# Patient Record
Sex: Female | Born: 1965 | Race: White | Hispanic: No | Marital: Married | Smoking: Current every day smoker
Health system: Southern US, Community
[De-identification: ages and names within clinical notes are randomized; demographics above are authoritative.]

## PROBLEM LIST (undated history)

## (undated) HISTORY — PX: ECTOPIC PREGNANCY SURGERY: SHX613

---

## 2010-06-23 ENCOUNTER — Emergency Department (HOSPITAL_BASED_OUTPATIENT_CLINIC_OR_DEPARTMENT_OTHER): Admission: EM | Admit: 2010-06-23 | Discharge: 2010-06-23 | Payer: Self-pay | Admitting: Emergency Medicine

## 2010-12-09 LAB — URINALYSIS, ROUTINE W REFLEX MICROSCOPIC
Ketones, ur: 40 mg/dL — AB
Nitrite: POSITIVE — AB
pH: 5 (ref 5.0–8.0)

## 2010-12-09 LAB — CBC
HCT: 34.4 % — ABNORMAL LOW (ref 36.0–46.0)
Hemoglobin: 11.1 g/dL — ABNORMAL LOW (ref 12.0–15.0)
MCV: 78 fL (ref 78.0–100.0)
RBC: 4.42 MIL/uL (ref 3.87–5.11)
RDW: 17.8 % — ABNORMAL HIGH (ref 11.5–15.5)
WBC: 15.9 10*3/uL — ABNORMAL HIGH (ref 4.0–10.5)

## 2010-12-09 LAB — DIFFERENTIAL
Eosinophils Relative: 1 % (ref 0–5)
Lymphocytes Relative: 10 % — ABNORMAL LOW (ref 12–46)
Lymphs Abs: 1.6 10*3/uL (ref 0.7–4.0)
Monocytes Absolute: 0.7 10*3/uL (ref 0.1–1.0)
Monocytes Relative: 4 % (ref 3–12)
Neutro Abs: 13.4 10*3/uL — ABNORMAL HIGH (ref 1.7–7.7)

## 2010-12-09 LAB — URINE MICROSCOPIC-ADD ON

## 2010-12-09 LAB — PREGNANCY, URINE: Preg Test, Ur: NEGATIVE

## 2011-09-14 ENCOUNTER — Emergency Department (INDEPENDENT_AMBULATORY_CARE_PROVIDER_SITE_OTHER): Payer: Self-pay

## 2011-09-14 ENCOUNTER — Emergency Department (HOSPITAL_BASED_OUTPATIENT_CLINIC_OR_DEPARTMENT_OTHER)
Admission: EM | Admit: 2011-09-14 | Discharge: 2011-09-14 | Disposition: A | Discharge status: Home / Self Care | Payer: Self-pay | Attending: Emergency Medicine | Admitting: Emergency Medicine

## 2011-09-14 ENCOUNTER — Encounter: Payer: Self-pay | Admitting: *Deleted

## 2011-09-14 DIAGNOSIS — M62838 Other muscle spasm: Secondary | ICD-10-CM

## 2011-09-14 DIAGNOSIS — M542 Cervicalgia: Secondary | ICD-10-CM | POA: Insufficient documentation

## 2011-09-14 DIAGNOSIS — M5380 Other specified dorsopathies, site unspecified: Secondary | ICD-10-CM | POA: Insufficient documentation

## 2011-09-14 DIAGNOSIS — M5030 Other cervical disc degeneration, unspecified cervical region: Secondary | ICD-10-CM

## 2011-09-14 MED ORDER — HYDROCODONE-ACETAMINOPHEN 5-500 MG PO TABS: 1.0000 | ORAL_TABLET | Freq: Four times a day (QID) | ORAL | Status: AC | PRN

## 2011-09-14 MED ORDER — CYCLOBENZAPRINE HCL 10 MG PO TABS
5.0000 mg | ORAL_TABLET | Freq: Once | ORAL | Status: AC
  Administered 2011-09-14: 5 mg via ORAL
  Filled 2011-09-14: qty 1

## 2011-09-14 MED ORDER — HYDROCODONE-ACETAMINOPHEN 5-325 MG PO TABS
1.0000 | ORAL_TABLET | Freq: Once | ORAL | Status: AC
  Administered 2011-09-14: 1 via ORAL
  Filled 2011-09-14: qty 1

## 2011-09-14 MED ORDER — CYCLOBENZAPRINE HCL 5 MG PO TABS: 5.0000 mg | ORAL_TABLET | Freq: Two times a day (BID) | ORAL | Status: AC | PRN

## 2011-09-14 NOTE — ED Provider Notes (Signed)
History     CSN: [id number] Arrival date & time: 09/14/2011  2:26 PM   First MD Initiated Contact with Patient 09/14/11 1438      Chief Complaint  Patient presents with  . Neck Pain    (Consider location/radiation/quality/duration/timing/severity/associated sxs/prior treatment) HPI Comments: Pt state that she has no know injury  Patient is a 45 y.o. female presenting with neck pain. The history is provided by the patient. No language interpreter was used.  Neck Pain  This is a new problem. The current episode started more than 1 week ago. The problem occurs constantly. The problem has not changed since onset.The pain is associated with an unknown factor. There has been no fever. The pain is present in the left side. The quality of the pain is described as aching. The pain does not radiate. The pain is moderate. The symptoms are aggravated by bending and twisting. The pain is the same all the time. Pertinent negatives include no visual change, no chest pain, no numbness, no tingling and no weakness. She has tried NSAIDs for the symptoms. The treatment provided mild relief.    History reviewed. No pertinent past medical history.  Past Surgical History  Procedure Date  . Ectopic pregnancy surgery     No family history on file.  History  Substance Use Topics  . Smoking status: Current Everyday Smoker  . Smokeless tobacco: Not on file  . Alcohol Use: No    OB History    Grav Para Term Preterm Abortions TAB SAB Ect Mult Living                  Review of Systems  HENT: Positive for neck pain.   Cardiovascular: Negative for chest pain.  Neurological: Negative for tingling, weakness and numbness.  All other systems reviewed and are negative.    Allergies  Review of patient's allergies indicates no known allergies.  Home Medications   Current Outpatient Rx  Name Route Sig Dispense Refill  . IBUPROFEN IB PO Oral Take by mouth as needed.        BP 145/106  Pulse 72   Temp(Src) 98.1 F (36.7 C) (Oral)  Resp 16  Ht 5\' 7"  (1.702 m)  Wt 147 lb (66.679 kg)  BMI 23.02 kg/m2  SpO2 100%  Physical Exam  Nursing note and vitals reviewed. Constitutional: She is oriented to person, place, and time. She appears well-developed and well-nourished.  HENT:  Head: Normocephalic and atraumatic.  Eyes: Pupils are equal, round, and reactive to light.  Neck: Normal range of motion. Neck supple.  Cardiovascular: Normal rate and regular rhythm.   Pulmonary/Chest: Effort normal and breath sounds normal.  Musculoskeletal:       Pt has left rhomboid tenderness and cervical paraspinal tenderness:pt has full rom:no gross deformity or swelling noted to the area:grip strength equal  Neurological: She is alert and oriented to person, place, and time.  Skin: Skin is warm and dry.  Psychiatric: She has a normal mood and affect.    ED Course  Procedures (including critical care time)  Labs Reviewed - No data to display Dg Cervical Spine Complete  09/14/2011  *RADIOLOGY REPORT*  Clinical Data: Left side neck pain.  CERVICAL SPINE - COMPLETE 4+ VIEW  Comparison: None.  Findings: Vertebral body height is maintained.  Trace anterolisthesis of C3 on C4 due to facet arthropathy is noted. Loss of disc space height appears worse from C4-5 to C6-7. Prevertebral soft tissues are unremarkable.  Lung apices  are clear.  IMPRESSION: No acute finding.  Marked appearing degenerative change.  Original Report Authenticated By: Bernadene Bell. D'ALESSIO, M.D.     1. Degenerative disc disease, cervical   2. Muscle spasm       MDM  Pt not having any deficits:will treat symptomatically       Teressa Lower, NP 09/14/11 1527

## 2011-09-14 NOTE — ED Notes (Signed)
C/o posterior neck pain x 3 months-denies injury with pain onset

## 2011-09-15 NOTE — ED Provider Notes (Signed)
Medical screening examination/treatment/procedure(s) were performed by non-physician practitioner and as supervising physician I was immediately available for consultation/collaboration.   Shanequia Kendrick A. Patrica Duel, MD 09/15/11 0700

## 2011-09-16 ENCOUNTER — Emergency Department (HOSPITAL_BASED_OUTPATIENT_CLINIC_OR_DEPARTMENT_OTHER)
Admission: EM | Admit: 2011-09-16 | Discharge: 2011-09-16 | Disposition: A | Discharge status: Home / Self Care | Payer: Self-pay | Attending: Emergency Medicine | Admitting: Emergency Medicine

## 2011-09-16 ENCOUNTER — Encounter (HOSPITAL_BASED_OUTPATIENT_CLINIC_OR_DEPARTMENT_OTHER): Payer: Self-pay

## 2011-09-16 DIAGNOSIS — M436 Torticollis: Secondary | ICD-10-CM | POA: Insufficient documentation

## 2011-09-16 DIAGNOSIS — M542 Cervicalgia: Secondary | ICD-10-CM | POA: Insufficient documentation

## 2011-09-16 MED ORDER — PREDNISONE 10 MG PO TABS: ORAL_TABLET | ORAL | Status: DC

## 2011-09-16 MED ORDER — OXYCODONE-ACETAMINOPHEN 5-325 MG PO TABS: 2.0000 | ORAL_TABLET | ORAL | Status: DC | PRN

## 2011-09-16 NOTE — ED Notes (Signed)
Pt. To see Dr. Earl Lites on Jan. 9th 2013

## 2011-09-16 NOTE — ED Notes (Signed)
Pt reports neck pain that started 3 months ago worsening this week.  She was seen in this ED 2 days ago and isn't better.

## 2011-09-16 NOTE — ED Provider Notes (Signed)
Medical screening examination/treatment/procedure(s) were performed by non-physician practitioner and as supervising physician I was immediately available for consultation/collaboration.   Leigh-Ann Romie Tay, MD 09/16/11 1516 

## 2011-09-16 NOTE — ED Provider Notes (Signed)
History     CSN: [id number]  Arrival date & time 09/16/11  1351   First MD Initiated Contact with Patient 09/16/11 1427      Chief Complaint  Patient presents with  . Torticollis    (Consider location/radiation/quality/duration/timing/severity/associated sxs/prior treatment) Patient is a 45 y.o. female presenting with neck injury. The history is provided by the patient. No language interpreter was used.  Neck Injury This is a recurrent problem. The current episode started more than 1 month ago. The problem occurs constantly. The problem has been gradually worsening. Associated symptoms include neck pain. Pertinent negatives include no joint swelling, myalgias, nausea, numbness or weakness. The symptoms are aggravated by exertion. She has tried acetaminophen and oral narcotics for the symptoms. The treatment provided no relief.   Pt was seen here 2 days ago for the same;  Pt reports she has continued pain and tightness in her neck.  Pt unable to get appointment with Orthopaedist until 1/9. History reviewed. No pertinent past medical history.  Past Surgical History  Procedure Date  . Ectopic pregnancy surgery     No family history on file.  History  Substance Use Topics  . Smoking status: Current Everyday Smoker  . Smokeless tobacco: Not on file  . Alcohol Use: No    OB History    Grav Para Term Preterm Abortions TAB SAB Ect Mult Living                  Review of Systems  HENT: Positive for neck pain.   Gastrointestinal: Negative for nausea.  Musculoskeletal: Negative for myalgias and joint swelling.  Neurological: Negative for weakness and numbness.    Allergies  Review of patient's allergies indicates no known allergies.  Home Medications   Current Outpatient Rx  Name Route Sig Dispense Refill  . CYCLOBENZAPRINE HCL 5 MG PO TABS Oral Take 1 tablet (5 mg total) by mouth 2 (two) times daily as needed for muscle spasms. 20 tablet 0  . HYDROCODONE-ACETAMINOPHEN  5-500 MG PO TABS Oral Take 1-2 tablets by mouth every 6 (six) hours as needed for pain. 15 tablet 0  . IBUPROFEN IB PO Oral Take by mouth as needed.        BP 152/88  Pulse 81  Temp(Src) 98.1 F (36.7 C) (Oral)  Resp 16  Ht 5\' 7"  (1.702 m)  Wt 147 lb (66.679 kg)  BMI 23.02 kg/m2  SpO2 99%  Physical Exam  Vitals reviewed. Constitutional: She is oriented to person, place, and time. She appears well-developed.  HENT:  Head: Normocephalic and atraumatic.  Right Ear: External ear normal.  Left Ear: External ear normal.  Nose: Nose normal.  Mouth/Throat: Oropharynx is clear and moist.  Eyes: Conjunctivae and EOM are normal. Pupils are equal, round, and reactive to light.  Neck: Normal range of motion. Neck supple.  Cardiovascular: Normal rate and normal heart sounds.   Pulmonary/Chest: Effort normal.  Abdominal: Soft. Bowel sounds are normal.  Musculoskeletal: She exhibits tenderness. She exhibits no edema.       Tender cervical spine and trapezius muscle area  Neurological: She is alert and oriented to person, place, and time.  Skin: Skin is warm.  Psychiatric: She has a normal mood and affect.    ED Course  Procedures (including critical care time)  Labs Reviewed - No data to display Dg Cervical Spine Complete  09/14/2011  *RADIOLOGY REPORT*  Clinical Data: Left side neck pain.  CERVICAL SPINE - COMPLETE 4+ VIEW  Comparison:  None.  Findings: Vertebral body height is maintained.  Trace anterolisthesis of C3 on C4 due to facet arthropathy is noted. Loss of disc space height appears worse from C4-5 to C6-7. Prevertebral soft tissues are unremarkable.  Lung apices are clear.  IMPRESSION: No acute finding.  Marked appearing degenerative change.  Original Report Authenticated By: Bernadene Bell. Maricela Curet, M.D.     No diagnosis found.    MDM  I will treat with prednisone and percocet.  Pt advised to keep appointment with Orthopaedist.        Langston Masker, PA 09/16/11 1455

## 2011-10-07 ENCOUNTER — Other Ambulatory Visit (HOSPITAL_COMMUNITY): Payer: Self-pay | Admitting: Orthopedic Surgery

## 2011-10-07 DIAGNOSIS — M542 Cervicalgia: Secondary | ICD-10-CM

## 2011-10-11 ENCOUNTER — Ambulatory Visit (HOSPITAL_COMMUNITY)
Admission: RE | Admit: 2011-10-11 | Discharge: 2011-10-11 | Disposition: A | Discharge status: Home / Self Care | Payer: Self-pay | Source: Ambulatory Visit | Attending: Orthopedic Surgery | Admitting: Orthopedic Surgery

## 2011-10-11 DIAGNOSIS — R209 Unspecified disturbances of skin sensation: Secondary | ICD-10-CM | POA: Insufficient documentation

## 2011-10-11 DIAGNOSIS — M47812 Spondylosis without myelopathy or radiculopathy, cervical region: Secondary | ICD-10-CM | POA: Insufficient documentation

## 2011-10-11 DIAGNOSIS — M79609 Pain in unspecified limb: Secondary | ICD-10-CM | POA: Insufficient documentation

## 2011-10-11 DIAGNOSIS — M4120 Other idiopathic scoliosis, site unspecified: Secondary | ICD-10-CM | POA: Insufficient documentation

## 2011-10-11 DIAGNOSIS — M542 Cervicalgia: Secondary | ICD-10-CM | POA: Insufficient documentation

## 2012-03-22 ENCOUNTER — Encounter (HOSPITAL_BASED_OUTPATIENT_CLINIC_OR_DEPARTMENT_OTHER): Payer: Self-pay | Admitting: Emergency Medicine

## 2012-03-22 ENCOUNTER — Emergency Department (HOSPITAL_BASED_OUTPATIENT_CLINIC_OR_DEPARTMENT_OTHER)
Admission: EM | Admit: 2012-03-22 | Discharge: 2012-03-23 | Disposition: A | Discharge status: Home / Self Care | Payer: Commercial Managed Care - PPO | Attending: Emergency Medicine | Admitting: Emergency Medicine

## 2012-03-22 DIAGNOSIS — T63301A Toxic effect of unspecified spider venom, accidental (unintentional), initial encounter: Secondary | ICD-10-CM

## 2012-03-22 DIAGNOSIS — W57XXXA Bitten or stung by nonvenomous insect and other nonvenomous arthropods, initial encounter: Secondary | ICD-10-CM | POA: Insufficient documentation

## 2012-03-22 DIAGNOSIS — S60469A Insect bite (nonvenomous) of unspecified finger, initial encounter: Secondary | ICD-10-CM | POA: Insufficient documentation

## 2012-03-22 DIAGNOSIS — F17200 Nicotine dependence, unspecified, uncomplicated: Secondary | ICD-10-CM | POA: Insufficient documentation

## 2012-03-22 NOTE — ED Notes (Signed)
Pt states she was bite by spider yesterday at 1700, reports pain to area, swelling, and burning

## 2012-03-23 MED ORDER — DOXYCYCLINE HYCLATE 100 MG PO CAPS: 100.0000 mg | ORAL_CAPSULE | Freq: Two times a day (BID) | ORAL | Status: AC

## 2012-03-23 MED ORDER — SODIUM CHLORIDE 0.9 % IV SOLN
Freq: Once | INTRAVENOUS | Status: AC
  Administered 2012-03-23: 20 mL/h via INTRAVENOUS

## 2012-03-23 MED ORDER — DOXYCYCLINE HYCLATE 100 MG PO TABS
100.0000 mg | ORAL_TABLET | Freq: Once | ORAL | Status: AC
  Administered 2012-03-23: 100 mg via ORAL
  Filled 2012-03-23: qty 1

## 2012-03-23 MED ORDER — VANCOMYCIN HCL IN DEXTROSE 1-5 GM/200ML-% IV SOLN
1000.0000 mg | Freq: Once | INTRAVENOUS | Status: AC
  Administered 2012-03-23: 1000 mg via INTRAVENOUS
  Filled 2012-03-23: qty 200

## 2012-03-23 MED ORDER — HYDROCODONE-ACETAMINOPHEN 5-325 MG PO TABS: 1.0000 | ORAL_TABLET | Freq: Four times a day (QID) | ORAL | Status: AC | PRN

## 2012-03-23 MED ORDER — FENTANYL CITRATE 0.05 MG/ML IJ SOLN
100.0000 ug | Freq: Once | INTRAMUSCULAR | Status: AC
  Administered 2012-03-23: 100 ug via INTRAVENOUS
  Filled 2012-03-23: qty 2

## 2012-03-23 NOTE — ED Provider Notes (Signed)
History     CSN: [id number]  Arrival date & time 03/22/12  2244   First MD Initiated Contact with Patient 03/23/12 0220      Chief Complaint  Patient presents with  . Insect Bite    (Consider location/radiation/quality/duration/timing/severity/associated sxs/prior treatment) HPI This is a 46 year old white female who states she was bit by a small brown spider 2 evenings ago. The bite was on the dorsal aspect of the proximal phalanx of the left index finger. She is subsequently developed pain, erythema, tenderness and swelling of the left index and middle fingers as well as the hand. The swelling is principally on the dorsal side of the hand. She has had chills but denies fever. She also reports generalized bone aches. She has had some nausea but it was transient.  History reviewed. No pertinent past medical history.  Past Surgical History  Procedure Date  . Ectopic pregnancy surgery     No family history on file.  History  Substance Use Topics  . Smoking status: Current Everyday Smoker -- 1.0 packs/day    Types: Cigarettes  . Smokeless tobacco: Not on file  . Alcohol Use: Yes     ocassionally    OB History    Grav Para Term Preterm Abortions TAB SAB Ect Mult Living                  Review of Systems  All other systems reviewed and are negative.    Allergies  Review of patient's allergies indicates no known allergies.  Home Medications   Current Outpatient Rx  Name Route Sig Dispense Refill  . BENADRYL ALLERGY PO Oral Take 1 tablet by mouth daily as needed. Patient used this medication for the swelling and itchiness.    . IBUPROFEN 200 MG PO TABS Oral Take 800 mg by mouth every 6 (six) hours as needed. Patient used this medication for her pain.    Marland Kitchen PREDNISONE 10 MG PO TABS  6.6.5.5.4.4.3.3.2.2.1.1 taper 15 tablet 0    BP 114/86  Pulse 85  Temp 98.3 F (36.8 C) (Oral)  Resp 20  SpO2 100%  Physical Exam General: Well-developed, well-nourished female in  no acute distress; appearance consistent with age of record HENT: normocephalic, atraumatic Eyes: pupils equal round and reactive to light; extraocular muscles intact Neck: supple Heart: regular rate and rhythm Lungs: clear to auscultation bilaterally Abdomen: soft; nondistended Extremities: No deformity; swelling, tenderness, erythema and warmth of the proximal aspects of the left second and third fingers and dorsal left hand without lymphangitis; decreased range of motion of the left second and third fingers due to swelling and pain; fingers are distally neurovascularly intact Neurologic: Awake, alert and oriented; motor function intact in all extremities and symmetric; no facial droop Skin: Warm and dry Psychiatric: Normal mood and affect    ED Course  Procedures (including critical care time)     MDM  3:48 AM Vancomycin 1 g IV given for suspected cellulitis secondary to spider bite. We will have patient return in 24 hours for reevaluation.        Hanley Seamen, MD 03/23/12 [phone]

## 2013-02-21 ENCOUNTER — Ambulatory Visit (INDEPENDENT_AMBULATORY_CARE_PROVIDER_SITE_OTHER): Payer: Commercial Managed Care - PPO | Admitting: Sports Medicine

## 2013-02-21 ENCOUNTER — Encounter: Payer: Self-pay | Admitting: Sports Medicine

## 2013-02-21 VITALS — BP 139/92 | HR 86 | Wt 151.0 lb

## 2013-02-21 DIAGNOSIS — F17200 Nicotine dependence, unspecified, uncomplicated: Secondary | ICD-10-CM

## 2013-02-21 DIAGNOSIS — M542 Cervicalgia: Secondary | ICD-10-CM

## 2013-02-21 DIAGNOSIS — E785 Hyperlipidemia, unspecified: Secondary | ICD-10-CM

## 2013-02-21 DIAGNOSIS — Z299 Encounter for prophylactic measures, unspecified: Secondary | ICD-10-CM | POA: Insufficient documentation

## 2013-02-21 DIAGNOSIS — M5030 Other cervical disc degeneration, unspecified cervical region: Secondary | ICD-10-CM | POA: Insufficient documentation

## 2013-02-21 NOTE — Assessment & Plan Note (Signed)
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 Will discuss further at future visits.

## 2013-02-21 NOTE — Progress Notes (Signed)
  Subjective:    CC: Establish care.   HPI:  This pleasant 47 year old female needs a physical for work, which she is scheduled for tomorrow.  Neck pain: Has an MRI which shows multilevel degenerative disc disease and spondylosis, she is amenable to discussing this further at a future visit, but her pain is localized, doesn't radiate, mild to moderate. Present for years.  Smoker: Has smoked for decades, is motivated to quit, and decreased her use significantly, down to half a pack a day. She is using any cigarettes. She has set her quit date is September 26, 2013.  Preventive measures: Due for Pap, up-to-date on all other measures.  Past medical history, Surgical history, Family history not pertinant except as noted below, Social history, Allergies, and medications have been entered into the medical record, reviewed, and no changes needed.   Review of Systems: No headache, visual changes, nausea, vomiting, diarrhea, constipation, dizziness, abdominal pain, skin rash, fevers, chills, night sweats, swollen lymph nodes, weight loss, chest pain, body aches, joint swelling, muscle aches, shortness of breath, mood changes, visual or auditory hallucinations.  Objective:    General: Well Developed, well nourished, and in no acute distress.  Neuro: Alert and oriented x3, extra-ocular muscles intact, sensation grossly intact.  HEENT: Normocephalic, atraumatic, pupils equal round reactive to light, neck supple, no masses, no lymphadenopathy, thyroid nonpalpable.  Skin: Warm and dry, no rashes noted.  Cardiac: Regular rate and rhythm, no murmurs rubs or gallops.  Respiratory: Clear to auscultation bilaterally. Not using accessory muscles, speaking in full sentences.  Abdominal: Soft, nontender, nondistended, positive bowel sounds, no masses, no organomegaly.  Musculoskeletal: Shoulder, elbow, wrist, hip, knee, ankle stable, and with full range of motion. Impression and Recommendations:    The patient  was counselled, risk factors were discussed, anticipatory guidance given.

## 2013-02-21 NOTE — Assessment & Plan Note (Signed)
Highly motivated to quit, is currently using  electric cigarettes. She is setting September 26, 2013 as her quit date. If she has a quit by then, I can certainly help her pharmacologically.

## 2013-02-21 NOTE — Assessment & Plan Note (Signed)
Due for a Pap smear. We can do this tomorrow at her physical. Routine blood work.

## 2013-02-22 ENCOUNTER — Encounter: Payer: Self-pay | Admitting: Sports Medicine

## 2013-02-22 ENCOUNTER — Ambulatory Visit: Payer: Commercial Managed Care - PPO

## 2013-02-22 ENCOUNTER — Ambulatory Visit (INDEPENDENT_AMBULATORY_CARE_PROVIDER_SITE_OTHER): Payer: Commercial Managed Care - PPO | Admitting: Sports Medicine

## 2013-02-22 VITALS — BP 130/87 | HR 80

## 2013-02-22 DIAGNOSIS — N951 Menopausal and female climacteric states: Secondary | ICD-10-CM

## 2013-02-22 DIAGNOSIS — R4586 Emotional lability: Secondary | ICD-10-CM

## 2013-02-22 DIAGNOSIS — Z299 Encounter for prophylactic measures, unspecified: Secondary | ICD-10-CM

## 2013-02-22 DIAGNOSIS — M542 Cervicalgia: Secondary | ICD-10-CM

## 2013-02-22 DIAGNOSIS — Z0000 Encounter for general adult medical examination without abnormal findings: Secondary | ICD-10-CM

## 2013-02-22 DIAGNOSIS — F418 Other specified anxiety disorders: Secondary | ICD-10-CM | POA: Insufficient documentation

## 2013-02-22 LAB — CBC
HCT: 45.2 % (ref 36.0–46.0)
Hemoglobin: 15.4 g/dL — ABNORMAL HIGH (ref 12.0–15.0)
MCH: 31.6 pg (ref 26.0–34.0)
MCHC: 34.1 g/dL (ref 30.0–36.0)
MCV: 92.6 fL (ref 78.0–100.0)
Platelets: 256 K/uL (ref 150–400)
RBC: 4.88 MIL/uL (ref 3.87–5.11)
RDW: 14.7 % (ref 11.5–15.5)
WBC: 9.5 K/uL (ref 4.0–10.5)

## 2013-02-22 LAB — COMPREHENSIVE METABOLIC PANEL
AST: 18 U/L (ref 0–37)
Albumin: 4.8 g/dL (ref 3.5–5.2)
Alkaline Phosphatase: 64 U/L (ref 39–117)
BUN: 9 mg/dL (ref 6–23)
Creat: 0.58 mg/dL (ref 0.50–1.10)
Glucose, Bld: 88 mg/dL (ref 70–99)

## 2013-02-22 LAB — COMPREHENSIVE METABOLIC PANEL WITH GFR
ALT: 15 U/L (ref 0–35)
CO2: 24 meq/L (ref 19–32)
Calcium: 9.3 mg/dL (ref 8.4–10.5)
Chloride: 102 meq/L (ref 96–112)
Potassium: 4.5 meq/L (ref 3.5–5.3)
Sodium: 138 meq/L (ref 135–145)
Total Bilirubin: 1 mg/dL (ref 0.3–1.2)
Total Protein: 7 g/dL (ref 6.0–8.3)

## 2013-02-22 LAB — TSH: TSH: 2.369 u[IU]/mL (ref 0.350–4.500)

## 2013-02-22 LAB — LIPID PANEL
Cholesterol: 224 mg/dL — ABNORMAL HIGH (ref 0–200)
HDL: 81 mg/dL (ref >39)
LDL Cholesterol: 124 mg/dL — ABNORMAL HIGH (ref 0–99)
Total CHOL/HDL Ratio: 2.8 Ratio
Triglycerides: 97 mg/dL (ref <150)
VLDL: 19 mg/dL (ref 0–40)

## 2013-02-22 MED ORDER — CYCLOBENZAPRINE HCL 10 MG PO TABS: ORAL_TABLET | ORAL | Status: DC

## 2013-02-22 MED ORDER — PREDNISONE 50 MG PO TABS: ORAL_TABLET | ORAL | Status: DC

## 2013-02-22 MED ORDER — CITALOPRAM HYDROBROMIDE 20 MG PO TABS: 20.0000 mg | ORAL_TABLET | Freq: Every day | ORAL | Status: DC

## 2013-02-22 MED ORDER — MELOXICAM 15 MG PO TABS: ORAL_TABLET | ORAL | Status: DC

## 2013-02-22 NOTE — Assessment & Plan Note (Signed)
This likely represents a left C8 radiculitis. Prednisone, Flexeril, Mobic, x-rays, formal physical therapy. Return in 4 weeks if no better, MRI.

## 2013-02-22 NOTE — Assessment & Plan Note (Signed)
Citalopram should help her hot flashes.

## 2013-02-22 NOTE — Progress Notes (Signed)
  Subjective:    CC: CPE  HPI:  This pleasant 47 year old female comes in for her complete physical. She also has some issues to discuss.  Neck pain: Present for months, localized in the left side of the mid cervical spine, radiating down to her fourth and fifth fingers causing numbness and tingling. Does wake her up at night, symptoms are moderate, persistent.  Labile mood: Feels irritable most of the time, this seems to come along with her hot flashes. She denies any symptoms of depression, suicidal or homicidal ideation.  Hot flashes: She is perimenopausal, would like to try an SSRI.  Preventative measures: Is going to get blood work today, she would also like a referral to OB/GYN for her cervical cancer screening.  Past medical history, Surgical history, Family history not pertinant except as noted below, Social history, Allergies, and medications have been entered into the medical record, reviewed, and no changes needed.   Review of Systems: No headache, visual changes, nausea, vomiting, diarrhea, constipation, dizziness, abdominal pain, skin rash, fevers, chills, night sweats, swollen lymph nodes, weight loss, chest pain, body aches, joint swelling, muscle aches, shortness of breath, mood changes, visual or auditory hallucinations.  Objective:    General: Well Developed, well nourished, and in no acute distress.  Neuro: Alert and oriented x3, extra-ocular muscles intact, sensation grossly intact.  HEENT: Normocephalic, atraumatic, pupils equal round reactive to light, neck supple, no masses, no lymphadenopathy, thyroid nonpalpable.  Skin: Warm and dry, no rashes noted.  Cardiac: Regular rate and rhythm, no murmurs rubs or gallops.  Respiratory: Clear to auscultation bilaterally. Not using accessory muscles, speaking in full sentences.  Abdominal: Soft, nontender, nondistended, positive bowel sounds, no masses, no organomegaly.  Neck: Inspection unremarkable. No palpable  stepoffs. Negative Spurling's maneuver. Full neck range of motion Grip strength and sensation normal in bilateral hands Strength good C4 to T1 distribution No sensory change to C4 to T1 Negative Hoffman sign bilaterally Reflexes normal Impression and Recommendations:    The patient was counselled, risk factors were discussed, anticipatory guidance given.

## 2013-02-22 NOTE — Assessment & Plan Note (Signed)
PAP with OBGYN per patient request. Awaiting bloodwork.

## 2013-02-22 NOTE — Assessment & Plan Note (Signed)
Starting citalopram.

## 2013-02-23 LAB — VITAMIN D 25 HYDROXY (VIT D DEFICIENCY, FRACTURES): Vit D, 25-Hydroxy: 58 ng/mL (ref 30–89)

## 2013-02-25 ENCOUNTER — Ambulatory Visit (HOSPITAL_BASED_OUTPATIENT_CLINIC_OR_DEPARTMENT_OTHER)
Admission: RE | Admit: 2013-02-25 | Discharge: 2013-02-25 | Disposition: A | Discharge status: Home / Self Care | Payer: Commercial Managed Care - PPO | Source: Ambulatory Visit | Attending: Sports Medicine | Admitting: Sports Medicine

## 2013-02-25 ENCOUNTER — Ambulatory Visit: Payer: Commercial Managed Care - PPO | Attending: Sports Medicine | Admitting: Physical Therapy

## 2013-02-25 ENCOUNTER — Other Ambulatory Visit: Payer: Self-pay | Admitting: Sports Medicine

## 2013-02-25 DIAGNOSIS — R29890 Loss of height: Secondary | ICD-10-CM | POA: Insufficient documentation

## 2013-02-25 DIAGNOSIS — M2560 Stiffness of unspecified joint, not elsewhere classified: Secondary | ICD-10-CM | POA: Insufficient documentation

## 2013-02-25 DIAGNOSIS — IMO0001 Reserved for inherently not codable concepts without codable children: Secondary | ICD-10-CM | POA: Insufficient documentation

## 2013-02-25 DIAGNOSIS — M542 Cervicalgia: Secondary | ICD-10-CM

## 2013-02-25 DIAGNOSIS — M2550 Pain in unspecified joint: Secondary | ICD-10-CM | POA: Insufficient documentation

## 2013-02-25 DIAGNOSIS — Q762 Congenital spondylolisthesis: Secondary | ICD-10-CM | POA: Insufficient documentation

## 2013-02-25 DIAGNOSIS — R209 Unspecified disturbances of skin sensation: Secondary | ICD-10-CM | POA: Insufficient documentation

## 2013-02-25 DIAGNOSIS — M6281 Muscle weakness (generalized): Secondary | ICD-10-CM | POA: Insufficient documentation

## 2013-02-25 DIAGNOSIS — E785 Hyperlipidemia, unspecified: Secondary | ICD-10-CM | POA: Insufficient documentation

## 2013-02-25 MED ORDER — ATORVASTATIN CALCIUM 40 MG PO TABS: 40.0000 mg | ORAL_TABLET | Freq: Every day | ORAL | Status: DC

## 2013-02-25 NOTE — Addendum Note (Signed)
Addended by: Monica Becton on: 02/25/2013 11:06 AM   Modules accepted: Orders

## 2013-02-25 NOTE — Assessment & Plan Note (Signed)
Starting Lipitor.

## 2013-02-28 ENCOUNTER — Ambulatory Visit: Payer: Commercial Managed Care - PPO | Admitting: Physical Therapy

## 2013-02-28 ENCOUNTER — Telehealth: Payer: Self-pay | Admitting: *Deleted

## 2013-02-28 MED ORDER — TRAMADOL HCL 50 MG PO TABS: 50.0000 mg | ORAL_TABLET | Freq: Three times a day (TID) | ORAL | Status: DC | PRN

## 2013-02-28 NOTE — Telephone Encounter (Signed)
Pt calls and asks if she can get something for pain. Barely made it through physical therapy yesterday and down there today and states she needs something to take to help her get through PT.

## 2013-02-28 NOTE — Telephone Encounter (Signed)
Adding tramadol, keep pushing it with physical therapy.

## 2013-03-01 NOTE — Telephone Encounter (Signed)
Pt notified of MD instructions and that med sent to pharmacy. Barry Dienes, LPN

## 2013-03-05 ENCOUNTER — Encounter: Payer: Commercial Managed Care - PPO | Admitting: Physical Therapy

## 2013-03-11 ENCOUNTER — Ambulatory Visit: Payer: Commercial Managed Care - PPO | Admitting: Physical Therapy

## 2013-03-13 ENCOUNTER — Telehealth: Payer: Self-pay | Admitting: *Deleted

## 2013-03-13 ENCOUNTER — Encounter: Payer: Commercial Managed Care - PPO | Admitting: Physical Therapy

## 2013-03-13 NOTE — Telephone Encounter (Signed)
Patient calls and states that she had to cancel her therapy appointment because she is in so much pain with her neck.  States at times it feels like its hard to breathe.  Please advise

## 2013-03-14 NOTE — Telephone Encounter (Signed)
LMOM for pt to return call. Kimberly Gordon, LPN  

## 2013-03-14 NOTE — Telephone Encounter (Signed)
Pt returned call and notified of results and instructions. Barry Dienes, LPN

## 2013-03-14 NOTE — Telephone Encounter (Signed)
Be sure to take the tramadol, can add 1300 mg of Acetaminophen 3 times a day, this would be 2 of the 650 arthritis strength over-the-counter Tylenols, need to finish physical therapy.

## 2013-03-22 ENCOUNTER — Encounter: Payer: Self-pay | Admitting: Sports Medicine

## 2013-03-22 ENCOUNTER — Telehealth: Payer: Self-pay | Admitting: *Deleted

## 2013-03-22 ENCOUNTER — Ambulatory Visit (INDEPENDENT_AMBULATORY_CARE_PROVIDER_SITE_OTHER): Payer: Commercial Managed Care - PPO

## 2013-03-22 ENCOUNTER — Ambulatory Visit (INDEPENDENT_AMBULATORY_CARE_PROVIDER_SITE_OTHER): Payer: Commercial Managed Care - PPO | Admitting: Sports Medicine

## 2013-03-22 VITALS — BP 141/101 | HR 109 | Wt 149.0 lb

## 2013-03-22 DIAGNOSIS — R0789 Other chest pain: Secondary | ICD-10-CM

## 2013-03-22 DIAGNOSIS — R079 Chest pain, unspecified: Secondary | ICD-10-CM

## 2013-03-22 DIAGNOSIS — S2242XA Multiple fractures of ribs, left side, initial encounter for closed fracture: Secondary | ICD-10-CM | POA: Insufficient documentation

## 2013-03-22 DIAGNOSIS — M542 Cervicalgia: Secondary | ICD-10-CM

## 2013-03-22 DIAGNOSIS — R071 Chest pain on breathing: Secondary | ICD-10-CM

## 2013-03-22 MED ORDER — HYDROCODONE-ACETAMINOPHEN 10-325 MG PO TABS: 1.0000 | ORAL_TABLET | Freq: Three times a day (TID) | ORAL | Status: DC | PRN

## 2013-03-22 MED ORDER — PROMETHAZINE HCL 25 MG PO TABS: 25.0000 mg | ORAL_TABLET | Freq: Four times a day (QID) | ORAL | Status: DC | PRN

## 2013-03-22 MED ORDER — PROMETHAZINE HCL 25 MG/ML IJ SOLN: 25.0000 mg | Freq: Once | INTRAMUSCULAR | Status: DC

## 2013-03-22 MED ORDER — KETOROLAC TROMETHAMINE 30 MG/ML IJ SOLN: 30.0000 mg | Freq: Once | INTRAMUSCULAR | Status: DC

## 2013-03-22 NOTE — Assessment & Plan Note (Signed)
After trauma while trying to set up an awning. She does have palpable deformity, as well as crepitus over her left upper ribs. B. suspect rib fracture, strapped with compressive dressing, she will practice incentive spirometry. Toradol 30 mg intramuscular, Phenergan 25 mg intramuscular. X-rays, hydrocodone. Return in one week.

## 2013-03-22 NOTE — Progress Notes (Signed)
  Subjective:    CC: Chest trauma  HPI: This is a very pleasant 47 year old female who comes in with a one-day history of severe pain she localizes the left upper chest, trying to put up an awning, unfortunately on injured, hitting her in the left upper chest. Now she has pleuritic pain, as well as a catching sensation when she takes a deep breath. The pain is localized, severe, does not radiate. She has no associated bruising, minimal nausea, no diaphoresis, pain is nonexertional, she has no pain in her neck, and pain does not radiate down either arm.  Past medical history, Surgical history, Family history not pertinant except as noted below, Social history, Allergies, and medications have been entered into the medical record, reviewed, and no changes needed.   Review of Systems: No fevers, chills, night sweats, weight loss, chest pain, or shortness of breath.   Objective:    General: Well Developed, well nourished, and in no acute distress.  Neuro: Alert and oriented x3, extra-ocular muscles intact, sensation grossly intact.  HEENT: Normocephalic, atraumatic, pupils equal round reactive to light, neck supple, no masses, no lymphadenopathy, thyroid nonpalpable.  Skin: Warm and dry, no rashes. Cardiac: Regular rate and rhythm, no murmurs rubs or gallops, no lower extremity edema.  Respiratory: Clear to auscultation bilaterally. Not using accessory muscles, speaking in full sentences. There is localized tenderness to palpation over the left anterior second rib, as well as the ribs at the anterior axillary line. Shoulder range of motion is good, there's no tenderness to palpation over the spinous processes of the cervical thoracic spine, lungs are clear auscultation bilaterally, and clavicle is nontender to palpation.  I did review x-rays, though they were read as negative, I do see a possible defect through the cortex of the second rib anteriorly.  I strapped the thorax with compressive  dressing. This improved her pain significantly. He was instructed on incentive spirometry to avoid atelectasis. Impression and Recommendations:

## 2013-03-22 NOTE — Assessment & Plan Note (Signed)
With cervical degenerative disc disease and spondylosis. She has failed physical therapy, oral medications. MRI has already been done. Her chest injury takes precedence today, we will likely need to proceed with interventional treatment in the future.

## 2013-03-22 NOTE — Telephone Encounter (Signed)
Called to obtain PA for CT chest w/o contrast. Spoke with Selena Batten C. [facility] and she states there is no PA needed.

## 2013-03-25 ENCOUNTER — Telehealth: Payer: Self-pay

## 2013-03-25 NOTE — Telephone Encounter (Signed)
Left message for patient to call office and schedule appointment.

## 2013-03-27 ENCOUNTER — Ambulatory Visit (HOSPITAL_BASED_OUTPATIENT_CLINIC_OR_DEPARTMENT_OTHER)
Admission: RE | Admit: 2013-03-27 | Discharge: 2013-03-27 | Disposition: A | Discharge status: Home / Self Care | Payer: Commercial Managed Care - PPO | Source: Ambulatory Visit | Attending: Sports Medicine | Admitting: Sports Medicine

## 2013-03-27 DIAGNOSIS — M949 Disorder of cartilage, unspecified: Secondary | ICD-10-CM | POA: Insufficient documentation

## 2013-03-27 DIAGNOSIS — J438 Other emphysema: Secondary | ICD-10-CM | POA: Insufficient documentation

## 2013-03-27 DIAGNOSIS — S2249XA Multiple fractures of ribs, unspecified side, initial encounter for closed fracture: Secondary | ICD-10-CM | POA: Insufficient documentation

## 2013-03-27 DIAGNOSIS — R079 Chest pain, unspecified: Secondary | ICD-10-CM | POA: Insufficient documentation

## 2013-03-27 DIAGNOSIS — X58XXXA Exposure to other specified factors, initial encounter: Secondary | ICD-10-CM | POA: Insufficient documentation

## 2013-03-27 DIAGNOSIS — Z87891 Personal history of nicotine dependence: Secondary | ICD-10-CM | POA: Insufficient documentation

## 2013-03-27 DIAGNOSIS — M899 Disorder of bone, unspecified: Secondary | ICD-10-CM | POA: Insufficient documentation

## 2013-03-28 ENCOUNTER — Encounter: Payer: Self-pay | Admitting: Sports Medicine

## 2013-03-28 ENCOUNTER — Ambulatory Visit (INDEPENDENT_AMBULATORY_CARE_PROVIDER_SITE_OTHER): Payer: Commercial Managed Care - PPO | Admitting: Sports Medicine

## 2013-03-28 VITALS — BP 135/94 | HR 90 | Wt 150.0 lb

## 2013-03-28 DIAGNOSIS — S2249XA Multiple fractures of ribs, unspecified side, initial encounter for closed fracture: Secondary | ICD-10-CM

## 2013-03-28 DIAGNOSIS — S2242XA Multiple fractures of ribs, left side, initial encounter for closed fracture: Secondary | ICD-10-CM

## 2013-03-28 MED ORDER — OXYCODONE-ACETAMINOPHEN 10-325 MG PO TABS: 1.0000 | ORAL_TABLET | Freq: Three times a day (TID) | ORAL | Status: DC | PRN

## 2013-03-28 NOTE — Progress Notes (Signed)
Patient coming in for OV today. Kassidy Dockendorf,CMA

## 2013-03-28 NOTE — Assessment & Plan Note (Addendum)
Nondisplaced, non-angulated. Continue relative rest, chest strapping. Hydrocodone is only moderately effective for pain. Increasing to Percocet 10/325.  I billed a fracture code for this visit, all subsequent visits for this complaint will be "post-op checks" in the global period.

## 2013-03-28 NOTE — Progress Notes (Signed)
  Subjective:    CC: Followup  HPI: This pleasant 47 year old female comes back for followup of chest injury that she recently sustained. She was putting up a tent, and unfortunately it hit her in the left chest. She had immediate pain, swelling, pleuritic pain, but no shortness of breath. No hemoptysis. She had described a clicking sensation with palpation and deep breathing. X-rays initially were negative but we did suspect a rib fracture. At that time I strapped her thorax with compressive dressing, and gave her some narcotic pain medication. She returns today feeling significantly better, and she did have quite significant symptoms, and I did think I saw a fracture on her x-ray, I obtained a CT scan without contrast of her chest. The CT scan did confirm fractures of her second and third rib anteriorly, there were nondisplaced.  Past medical history, Surgical history, Family history not pertinant except as noted below, Social history, Allergies, and medications have been entered into the medical record, reviewed, and no changes needed.   Review of Systems: No fevers, chills, night sweats, weight loss, chest pain, or shortness of breath.   Objective:    General: Well Developed, well nourished, and in no acute distress.  Neuro: Alert and oriented x3, extra-ocular muscles intact, sensation grossly intact.  HEENT: Normocephalic, atraumatic, pupils equal round reactive to light, neck supple, no masses, no lymphadenopathy, thyroid nonpalpable.  Skin: Warm and dry, no rashes. Cardiac: Regular rate and rhythm, no murmurs rubs or gallops, no lower extremity edema.  Respiratory: Clear to auscultation bilaterally. Not using accessory muscles, speaking in full sentences. Still very tender to palpation of the anterior chest over the second and third ribs.  CT scan was reviewed, there is a nondisplaced fracture through the anterior aspect of the second and third ribs on the left side.  Impression and  Recommendations:

## 2013-04-01 ENCOUNTER — Telehealth: Payer: Self-pay | Admitting: *Deleted

## 2013-04-01 MED ORDER — HYDROMORPHONE HCL 8 MG PO TABS: 8.0000 mg | ORAL_TABLET | Freq: Four times a day (QID) | ORAL | Status: DC | PRN

## 2013-04-01 NOTE — Telephone Encounter (Signed)
This probably means dilaudid. She should start with one half tab initially, and if still insufficient can try an entire tablet. Rx will be waiting in my outbox.

## 2013-04-01 NOTE — Telephone Encounter (Signed)
Patient calls and states that you were gonna give her a stronger pain med than what she was on and she states she would like to have that now

## 2013-04-01 NOTE — Telephone Encounter (Signed)
Pt notifeid can pick up at office. Barry Dienes, LPN

## 2013-04-11 ENCOUNTER — Ambulatory Visit: Payer: Commercial Managed Care - PPO | Admitting: Sports Medicine

## 2013-04-11 ENCOUNTER — Encounter: Payer: Self-pay | Admitting: Sports Medicine

## 2013-04-11 VITALS — BP 132/92 | HR 72 | Wt 151.0 lb

## 2013-04-11 DIAGNOSIS — S2242XA Multiple fractures of ribs, left side, initial encounter for closed fracture: Secondary | ICD-10-CM

## 2013-04-11 MED ORDER — OXYCODONE-ACETAMINOPHEN 10-325 MG PO TABS: 1.0000 | ORAL_TABLET | Freq: Three times a day (TID) | ORAL | Status: DC | PRN

## 2013-04-11 NOTE — Assessment & Plan Note (Signed)
Sling for use at all times on the left side. Refilling Percocet. Return to see me in 3 weeks. We did discuss the rib fractures can take 6+ weeks to heal and become fully pain-free, it's only been one week.

## 2013-04-11 NOTE — Patient Instructions (Addendum)
Rib Fracture  Your caregiver has diagnosed you as having a rib fracture (a break). This can occur by a blow to the chest, by a fall against a hard object, or by violent coughing or sneezing. There may be one or many breaks. Rib fractures may heal on their own within 3 to 8 weeks. The longer healing period is usually associated with a continued cough or other aggravating activities.  HOME CARE INSTRUCTIONS    Avoid strenuous activity. Be careful during activities and avoid bumping the injured rib. Activities that cause pain pull on the fracture site(s) and are best avoided if possible.   Eat a normal, well-balanced diet. Drink plenty of fluids to avoid constipation.   Take deep breaths several times a day to keep lungs free of infection. Try to cough several times a day, splinting the injured area with a pillow. This will help prevent pneumonia.   Do not wear a rib belt or binder. These restrict breathing which can lead to pneumonia.   Only take over-the-counter or prescription medicines for pain, discomfort, or fever as directed by your caregiver.  SEEK MEDICAL CARE IF:   You develop a continual cough, associated with thick or bloody sputum.  SEEK IMMEDIATE MEDICAL CARE IF:    You have a fever.   You have difficulty breathing.   You have nausea (feeling sick to your stomach), vomiting, or abdominal (belly) pain.   You have worsening pain, not controlled with medications.  Document Released: 09/12/2005 Document Revised: 12/05/2011 Document Reviewed: 02/14/2007  [employer] Patient Information 2014 [employer].

## 2013-04-11 NOTE — Progress Notes (Signed)
  Subjective: One week status post fracture of the left second and third ribs, Percocet works well, still with a significant amount of pain.   Objective: General: Well-developed, well-nourished, and in no acute distress. Exquisite tenderness to palpation over the fracture site.  Sling placed.  Assessment/plan:

## 2013-04-18 ENCOUNTER — Telehealth: Payer: Self-pay

## 2013-04-18 MED ORDER — HYDROMORPHONE HCL 8 MG PO TABS: 8.0000 mg | ORAL_TABLET | Freq: Four times a day (QID) | ORAL | Status: DC | PRN

## 2013-04-18 NOTE — Telephone Encounter (Signed)
Prescription is in my outbox. Is this working well?

## 2013-04-18 NOTE — Telephone Encounter (Signed)
Pt called stated that she needs a refill for HydroMorphone 8 mg

## 2013-04-22 ENCOUNTER — Other Ambulatory Visit: Payer: Self-pay

## 2013-04-22 MED ORDER — HYDROMORPHONE HCL 8 MG PO TABS: 8.0000 mg | ORAL_TABLET | Freq: Four times a day (QID) | ORAL | Status: DC | PRN

## 2013-05-01 ENCOUNTER — Encounter: Payer: Self-pay | Admitting: Sports Medicine

## 2013-05-01 ENCOUNTER — Ambulatory Visit: Payer: Commercial Managed Care - PPO | Admitting: Sports Medicine

## 2013-05-01 VITALS — BP 142/89 | HR 86 | Wt 147.0 lb

## 2013-05-01 DIAGNOSIS — S2242XA Multiple fractures of ribs, left side, initial encounter for closed fracture: Secondary | ICD-10-CM

## 2013-05-01 MED ORDER — OXYCODONE-ACETAMINOPHEN 10-325 MG PO TABS: 1.0000 | ORAL_TABLET | Freq: Three times a day (TID) | ORAL | Status: DC | PRN

## 2013-05-01 NOTE — Progress Notes (Signed)
  Subjective: This pleasant young lady is about 5 weeks status post fractures of the second and third ribs on the left side. Improving, still with some pain.   Objective: General: Well-developed, well-nourished, and in no acute distress. Still tender to palpation over the rib fractures, there is no longer any crepitus.  Assessment/plan:

## 2013-05-01 NOTE — Assessment & Plan Note (Signed)
Continues to improve, discontinue Dilantin. Refilling Percocet for now. Return in one month to 6 weeks.

## 2013-05-29 ENCOUNTER — Ambulatory Visit: Payer: Commercial Managed Care - PPO | Admitting: Sports Medicine

## 2013-05-30 ENCOUNTER — Telehealth: Payer: Self-pay

## 2013-05-30 DIAGNOSIS — S2242XA Multiple fractures of ribs, left side, initial encounter for closed fracture: Secondary | ICD-10-CM

## 2013-05-30 NOTE — Telephone Encounter (Signed)
Patient called stated that she missed her appt on 09/03. She has rescheduled for 09/08, she wanst to know if you can call her in something for pain to her pharmacy to hold her til her appointment.

## 2013-05-31 MED ORDER — OXYCODONE-ACETAMINOPHEN 10-325 MG PO TABS: 1.0000 | ORAL_TABLET | Freq: Three times a day (TID) | ORAL | Status: DC | PRN

## 2013-05-31 NOTE — Telephone Encounter (Signed)
I will refill her Percocet, now that she is 9 weeks out from the fracture I am going to start to taper down the quantity.

## 2013-05-31 NOTE — Telephone Encounter (Signed)
Patient informed that Rx was here at office ready for pick up. Rhonda Cunningham,CMA

## 2013-06-03 ENCOUNTER — Ambulatory Visit: Payer: Commercial Managed Care - PPO | Admitting: Sports Medicine

## 2013-06-07 ENCOUNTER — Ambulatory Visit: Payer: Commercial Managed Care - PPO | Admitting: Sports Medicine

## 2013-06-07 DIAGNOSIS — Z0289 Encounter for other administrative examinations: Secondary | ICD-10-CM

## 2013-06-19 ENCOUNTER — Ambulatory Visit (INDEPENDENT_AMBULATORY_CARE_PROVIDER_SITE_OTHER): Payer: Commercial Managed Care - PPO | Admitting: Sports Medicine

## 2013-06-19 ENCOUNTER — Encounter: Payer: Self-pay | Admitting: Sports Medicine

## 2013-06-19 ENCOUNTER — Ambulatory Visit (INDEPENDENT_AMBULATORY_CARE_PROVIDER_SITE_OTHER): Payer: Commercial Managed Care - PPO

## 2013-06-19 VITALS — BP 141/98 | HR 80 | Wt 151.0 lb

## 2013-06-19 DIAGNOSIS — F39 Unspecified mood [affective] disorder: Secondary | ICD-10-CM

## 2013-06-19 DIAGNOSIS — M898X9 Other specified disorders of bone, unspecified site: Secondary | ICD-10-CM

## 2013-06-19 DIAGNOSIS — N951 Menopausal and female climacteric states: Secondary | ICD-10-CM

## 2013-06-19 DIAGNOSIS — S2242XA Multiple fractures of ribs, left side, initial encounter for closed fracture: Secondary | ICD-10-CM

## 2013-06-19 DIAGNOSIS — R4586 Emotional lability: Secondary | ICD-10-CM

## 2013-06-19 DIAGNOSIS — M19049 Primary osteoarthritis, unspecified hand: Secondary | ICD-10-CM

## 2013-06-19 DIAGNOSIS — M5030 Other cervical disc degeneration, unspecified cervical region: Secondary | ICD-10-CM

## 2013-06-19 MED ORDER — OXYCODONE-ACETAMINOPHEN 7.5-325 MG PO TABS: 1.0000 | ORAL_TABLET | Freq: Three times a day (TID) | ORAL | Status: DC | PRN

## 2013-06-19 MED ORDER — CITALOPRAM HYDROBROMIDE 20 MG PO TABS: 20.0000 mg | ORAL_TABLET | Freq: Every day | ORAL | Status: DC

## 2013-06-19 NOTE — Assessment & Plan Note (Signed)
Was 3 weeks into physical therapy when she broke her ribs. I do need her to continue one to 2 more weeks of PT before we consider interventional treatment. She is multilevel disc protrusions at C4-5, C5-6, C6-7, worse at the C5-6 level with mild central canal stenosis. I am going to refill her Percocet we will start a monthly down taper. Return to see me in 2-3 weeks for this.

## 2013-06-19 NOTE — Progress Notes (Signed)
  Subjective:    CC: Followup  HPI: Left second and third rib fracture: Pain is essentially resolved.  Neck pain: Worse when she looks down, does have an MRI from the past, this was ordered by her old physician, nothing was really don't, she did have a few weeks of physical therapy but had to stop due to the rib fracture. Now has persistent pain. Localized, doesn't radiate, moderate.  Hand pain: Localized at the carpometacarpal joints of both thumbs. Moderate, persistent, she has difficulty tying her shoes. Oral narcotics are ineffective.  Past medical history, Surgical history, Family history not pertinant except as noted below, Social history, Allergies, and medications have been entered into the medical record, reviewed, and no changes needed.   Review of Systems: No fevers, chills, night sweats, weight loss, chest pain, or shortness of breath.   Objective:    General: Well Developed, well nourished, and in no acute distress.  Neuro: Alert and oriented x3, extra-ocular muscles intact, sensation grossly intact.  HEENT: Normocephalic, atraumatic, pupils equal round reactive to light, neck supple, no masses, no lymphadenopathy, thyroid nonpalpable.  Skin: Warm and dry, no rashes. Cardiac: Regular rate and rhythm, no murmurs rubs or gallops, no lower extremity edema.  Respiratory: Clear to auscultation bilaterally. Not using accessory muscles, speaking in full sentences. Bilateral hands: There is tenderness as well as swelling over the carpometacarpal joints, left worse than right.  Procedure: Real-time Ultrasound Guided Injection of left trapeziometacarpal joint. Device: GE Logiq E  Verbal informed consent obtained.  Time-out conducted.  Noted no overlying erythema, induration, or other signs of local infection.  Skin prepped in a sterile fashion.  Local anesthesia: Topical Ethyl chloride.  With sterile technique and under real time ultrasound guidance:  0.5 cc Kenalog 40, 0.5 cc  lidocaine injected easily into the joint. Completed without difficulty  Pain immediately resolved suggesting accurate placement of the medication.  Advised to call if fevers/chills, erythema, induration, drainage, or persistent bleeding.  Images permanently stored and available for review in the ultrasound unit.  Impression: Technically successful ultrasound guided injection.  Procedure: Real-time Ultrasound Guided Injection of right trapeziometacarpal joint. Device: GE Logiq E  Verbal informed consent obtained.  Time-out conducted.  Noted no overlying erythema, induration, or other signs of local infection.  Skin prepped in a sterile fashion.  Local anesthesia: Topical Ethyl chloride.  With sterile technique and under real time ultrasound guidance:  0.5 cc Kenalog 40, 0.5 cc lidocaine injected easily into the joint. Completed without difficulty  Pain immediately resolved suggesting accurate placement of the medication.  Advised to call if fevers/chills, erythema, induration, drainage, or persistent bleeding.  Images permanently stored and available for review in the ultrasound unit.  Impression: Technically successful ultrasound guided injection.  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  Impression and Recommendations:

## 2013-06-19 NOTE — Assessment & Plan Note (Signed)
Bilateral injections as above. X-rays.

## 2013-06-19 NOTE — Assessment & Plan Note (Signed)
11 weeks status post fractures of the second and third ribs on the left, overall pain-free.

## 2013-06-19 NOTE — Assessment & Plan Note (Signed)
Having an increase in hot flashes/vessel motor instability. Refilling citalopram.

## 2013-06-20 ENCOUNTER — Ambulatory Visit: Payer: Commercial Managed Care - PPO | Admitting: Sports Medicine

## 2013-06-27 ENCOUNTER — Telehealth: Payer: Self-pay | Admitting: *Deleted

## 2013-06-27 DIAGNOSIS — M5030 Other cervical disc degeneration, unspecified cervical region: Secondary | ICD-10-CM

## 2013-06-27 NOTE — Telephone Encounter (Signed)
Notified pt of orders via vm

## 2013-06-27 NOTE — Telephone Encounter (Signed)
Pt calls requesting for a new referral to start PT again.

## 2013-06-27 NOTE — Telephone Encounter (Signed)
Orders placed.

## 2013-07-01 ENCOUNTER — Other Ambulatory Visit: Payer: Self-pay

## 2013-07-04 ENCOUNTER — Telehealth: Payer: Self-pay

## 2013-07-04 NOTE — Telephone Encounter (Signed)
Patient request refill for Oxycodone 7.5-325 mg

## 2013-07-04 NOTE — Telephone Encounter (Signed)
Spoke to patient gave her instructions as noted below. Rhonda Cunningham,CMA  

## 2013-07-04 NOTE — Telephone Encounter (Signed)
Refills were to be monthly, she will come back every month and I will decrease her dose as well as quantity every time. It is too early.

## 2013-07-08 ENCOUNTER — Telehealth: Payer: Self-pay

## 2013-07-08 NOTE — Telephone Encounter (Signed)
Patient advised.

## 2013-07-08 NOTE — Telephone Encounter (Signed)
She states she can not lift her head up due to her neck pain. She wants an early refill on her Percocet.

## 2013-07-08 NOTE — Telephone Encounter (Signed)
I cannot refill it this early, she needs to come see me in a week and I will consider refilling it then

## 2013-07-09 ENCOUNTER — Ambulatory Visit: Payer: Commercial Managed Care - PPO | Admitting: Physical Therapy

## 2013-07-19 ENCOUNTER — Other Ambulatory Visit: Payer: Self-pay

## 2013-07-19 DIAGNOSIS — M5030 Other cervical disc degeneration, unspecified cervical region: Secondary | ICD-10-CM

## 2013-07-19 MED ORDER — OXYCODONE-ACETAMINOPHEN 5-325 MG PO TABS: 1.0000 | ORAL_TABLET | Freq: Three times a day (TID) | ORAL | Status: DC | PRN

## 2013-07-19 NOTE — Telephone Encounter (Signed)
She is ready for her refill on the oxycodone.

## 2013-07-19 NOTE — Telephone Encounter (Signed)
Prescription is in my box, we are continuing a steady down taper.  This is in the hopes of getting her off of the medicine, there will be some painful days, overall she will do better.

## 2013-07-19 NOTE — Assessment & Plan Note (Signed)
Continuing down taper of Percocet, #40 pills per month, going from 7.5 to 5 mg. Next month we will decrease it to 20 pills per month, then 10 pills per month, then stop.

## 2013-07-24 ENCOUNTER — Ambulatory Visit (INDEPENDENT_AMBULATORY_CARE_PROVIDER_SITE_OTHER): Payer: Commercial Managed Care - PPO

## 2013-07-24 ENCOUNTER — Encounter (INDEPENDENT_AMBULATORY_CARE_PROVIDER_SITE_OTHER): Payer: Self-pay

## 2013-07-24 DIAGNOSIS — M5030 Other cervical disc degeneration, unspecified cervical region: Secondary | ICD-10-CM

## 2013-07-29 ENCOUNTER — Encounter (INDEPENDENT_AMBULATORY_CARE_PROVIDER_SITE_OTHER): Payer: Commercial Managed Care - PPO | Admitting: Physical Therapy

## 2013-07-29 DIAGNOSIS — M542 Cervicalgia: Secondary | ICD-10-CM

## 2013-07-29 DIAGNOSIS — R293 Abnormal posture: Secondary | ICD-10-CM

## 2013-08-02 ENCOUNTER — Encounter (INDEPENDENT_AMBULATORY_CARE_PROVIDER_SITE_OTHER): Payer: Commercial Managed Care - PPO | Admitting: Physical Therapy

## 2013-08-02 DIAGNOSIS — R293 Abnormal posture: Secondary | ICD-10-CM

## 2013-08-02 DIAGNOSIS — M542 Cervicalgia: Secondary | ICD-10-CM

## 2013-08-06 ENCOUNTER — Encounter: Payer: Commercial Managed Care - PPO | Admitting: Physical Therapy

## 2013-08-16 ENCOUNTER — Encounter (INDEPENDENT_AMBULATORY_CARE_PROVIDER_SITE_OTHER): Payer: Commercial Managed Care - PPO

## 2013-08-16 ENCOUNTER — Telehealth: Payer: Self-pay

## 2013-08-16 DIAGNOSIS — M542 Cervicalgia: Secondary | ICD-10-CM

## 2013-08-16 DIAGNOSIS — R293 Abnormal posture: Secondary | ICD-10-CM

## 2013-08-16 MED ORDER — TRAMADOL HCL 50 MG PO TABS: 50.0000 mg | ORAL_TABLET | Freq: Three times a day (TID) | ORAL | Status: DC | PRN

## 2013-08-16 MED ORDER — CYCLOBENZAPRINE HCL 10 MG PO TABS: ORAL_TABLET | ORAL | Status: DC

## 2013-08-16 NOTE — Telephone Encounter (Signed)
Refilled flexeril and tramadol

## 2013-08-20 ENCOUNTER — Encounter: Payer: Self-pay | Admitting: Sports Medicine

## 2013-08-20 ENCOUNTER — Encounter: Payer: Commercial Managed Care - PPO | Admitting: Physical Therapy

## 2013-08-20 ENCOUNTER — Ambulatory Visit (INDEPENDENT_AMBULATORY_CARE_PROVIDER_SITE_OTHER): Payer: Commercial Managed Care - PPO | Admitting: Sports Medicine

## 2013-08-20 VITALS — BP 176/109 | HR 90 | Wt 157.0 lb

## 2013-08-20 DIAGNOSIS — M5030 Other cervical disc degeneration, unspecified cervical region: Secondary | ICD-10-CM

## 2013-08-20 MED ORDER — OXYCODONE-ACETAMINOPHEN 5-325 MG PO TABS: 1.0000 | ORAL_TABLET | Freq: Three times a day (TID) | ORAL | Status: DC | PRN

## 2013-08-20 NOTE — Progress Notes (Signed)
  Subjective:    CC: Followup  HPI: Cervical degenerative disc disease: This pleasant patient comes back, she has been through physical therapy, steroids, NSAIDs, muscle relaxers, narcotics, continues to have pain which he localizes in the left upper shoulder, and neck. She does have an MRI with multilevel cervical degenerative changes. Pain is severe, persistent. No changes in gait, bowel or bladder dysfunction.  Past medical history, Surgical history, Family history not pertinant except as noted below, Social history, Allergies, and medications have been entered into the medical record, reviewed, and no changes needed.   Review of Systems: No fevers, chills, night sweats, weight loss, chest pain, or shortness of breath.   Objective:    General: Well Developed, well nourished, and in no acute distress.  Neuro: Alert and oriented x3, extra-ocular muscles intact, sensation grossly intact.  HEENT: Normocephalic, atraumatic, pupils equal round reactive to light, neck supple, no masses, no lymphadenopathy, thyroid nonpalpable.  Skin: Warm and dry, no rashes. Cardiac: Regular rate and rhythm, no murmurs rubs or gallops, no lower extremity edema.  Respiratory: Clear to auscultation bilaterally. Not using accessory muscles, speaking in full sentences.  Impression and Recommendations:

## 2013-08-20 NOTE — Assessment & Plan Note (Signed)
This point has failed physical therapy, steroids, NSAIDs, muscle relaxers, narcotics. Symptoms are worse in the left side, and she has multilevel degenerative disc disease at C4-5, C5-6, and C6-7 levels. We are going to proceed with a left-sided cervical epidural. Return one week after the injection. Refilling Percocet but continuing a monthly down taper.

## 2013-08-26 ENCOUNTER — Ambulatory Visit
Admission: RE | Admit: 2013-08-26 | Discharge: 2013-08-26 | Disposition: A | Discharge status: Home / Self Care | Payer: Commercial Managed Care - PPO | Source: Ambulatory Visit | Attending: Sports Medicine | Admitting: Sports Medicine

## 2013-08-26 VITALS — BP 146/100 | HR 79

## 2013-08-26 DIAGNOSIS — M5030 Other cervical disc degeneration, unspecified cervical region: Secondary | ICD-10-CM

## 2013-08-26 MED ORDER — IOHEXOL 300 MG/ML  SOLN
1.0000 mL | Freq: Once | INTRAMUSCULAR | Status: AC | PRN
  Administered 2013-08-26: 1 mL via EPIDURAL

## 2013-08-26 MED ORDER — TRIAMCINOLONE ACETONIDE 40 MG/ML IJ SUSP (RADIOLOGY)
60.0000 mg | Freq: Once | INTRAMUSCULAR | Status: AC
  Administered 2013-08-26: 60 mg via EPIDURAL

## 2013-08-27 ENCOUNTER — Ambulatory Visit: Payer: Commercial Managed Care - PPO | Admitting: Sports Medicine

## 2013-08-27 DIAGNOSIS — Z0289 Encounter for other administrative examinations: Secondary | ICD-10-CM

## 2014-07-16 IMAGING — CR DG RIBS W/ CHEST 3+V*L*
3 series · 3 of 3 positions shown · non-contrast
Comparison: None.

CLINICAL DATA: Left upper chest pain.

LEFT RIBS AND CHEST - 3+ VIEW

[view not recorded (1 of 3)]
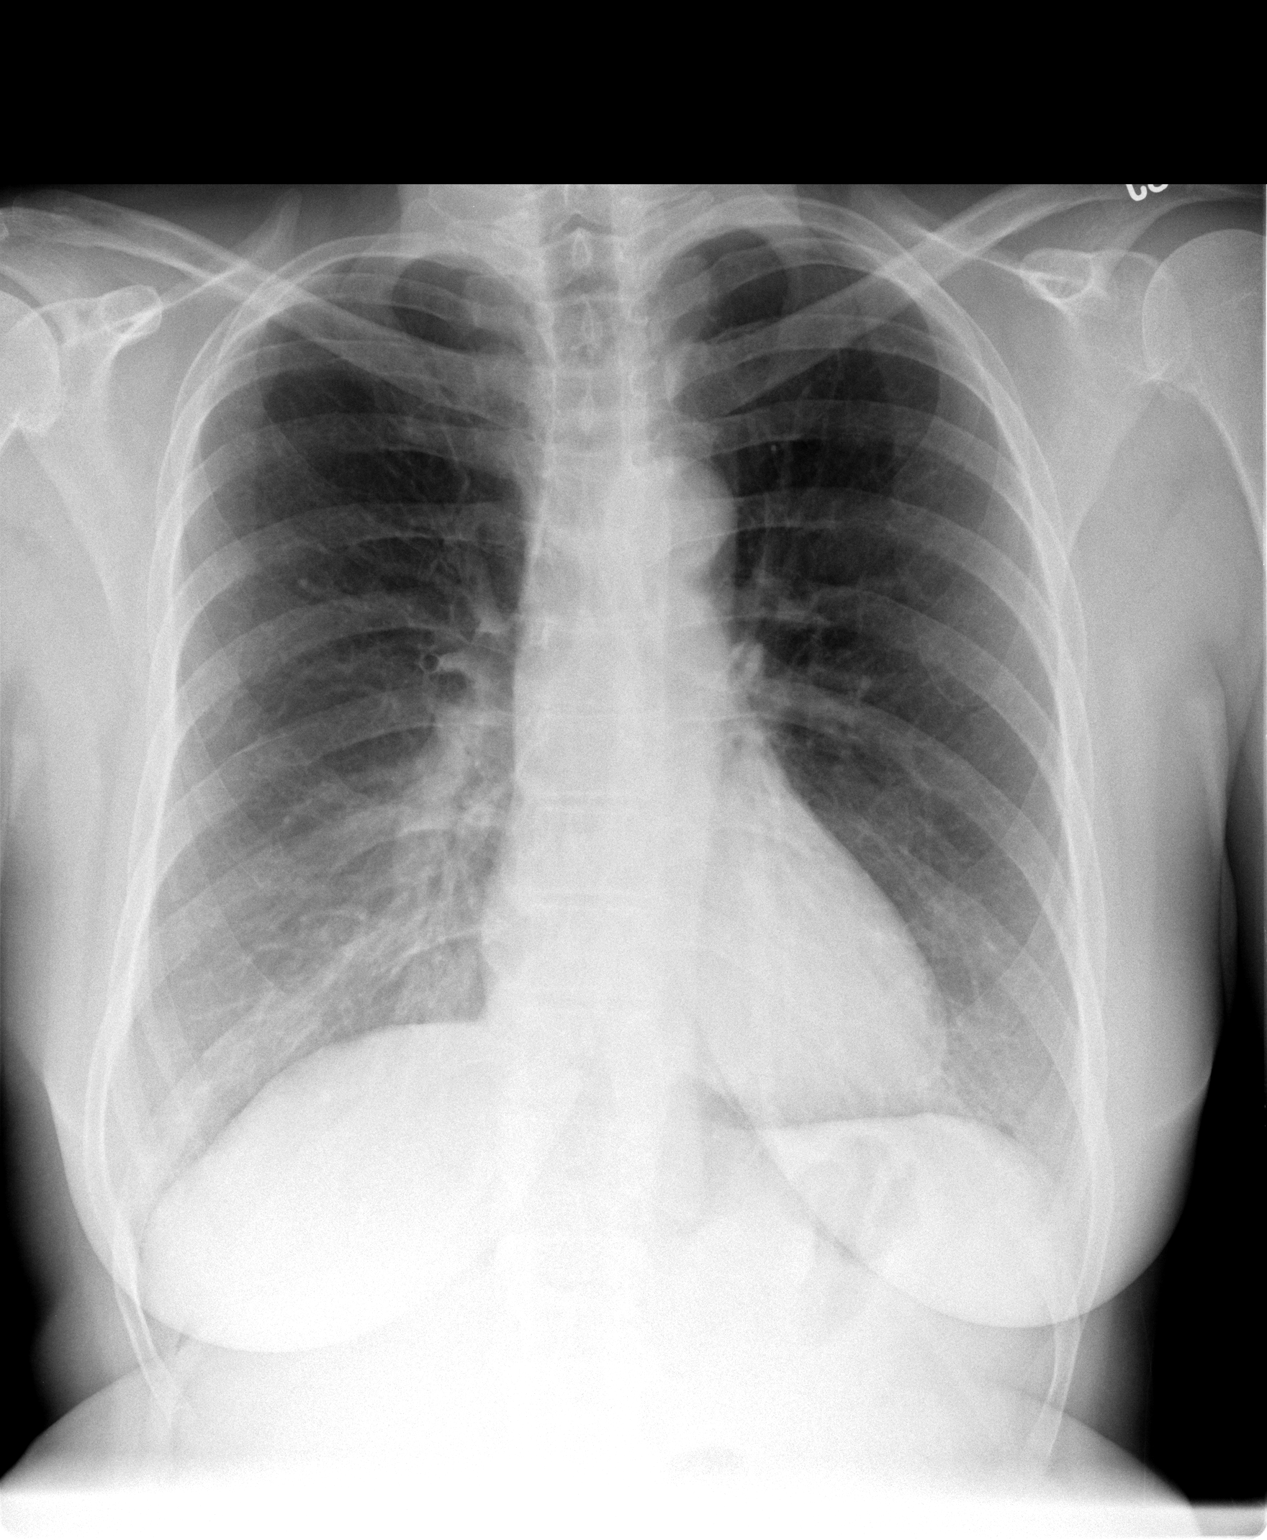

[view not recorded (2 of 3)]
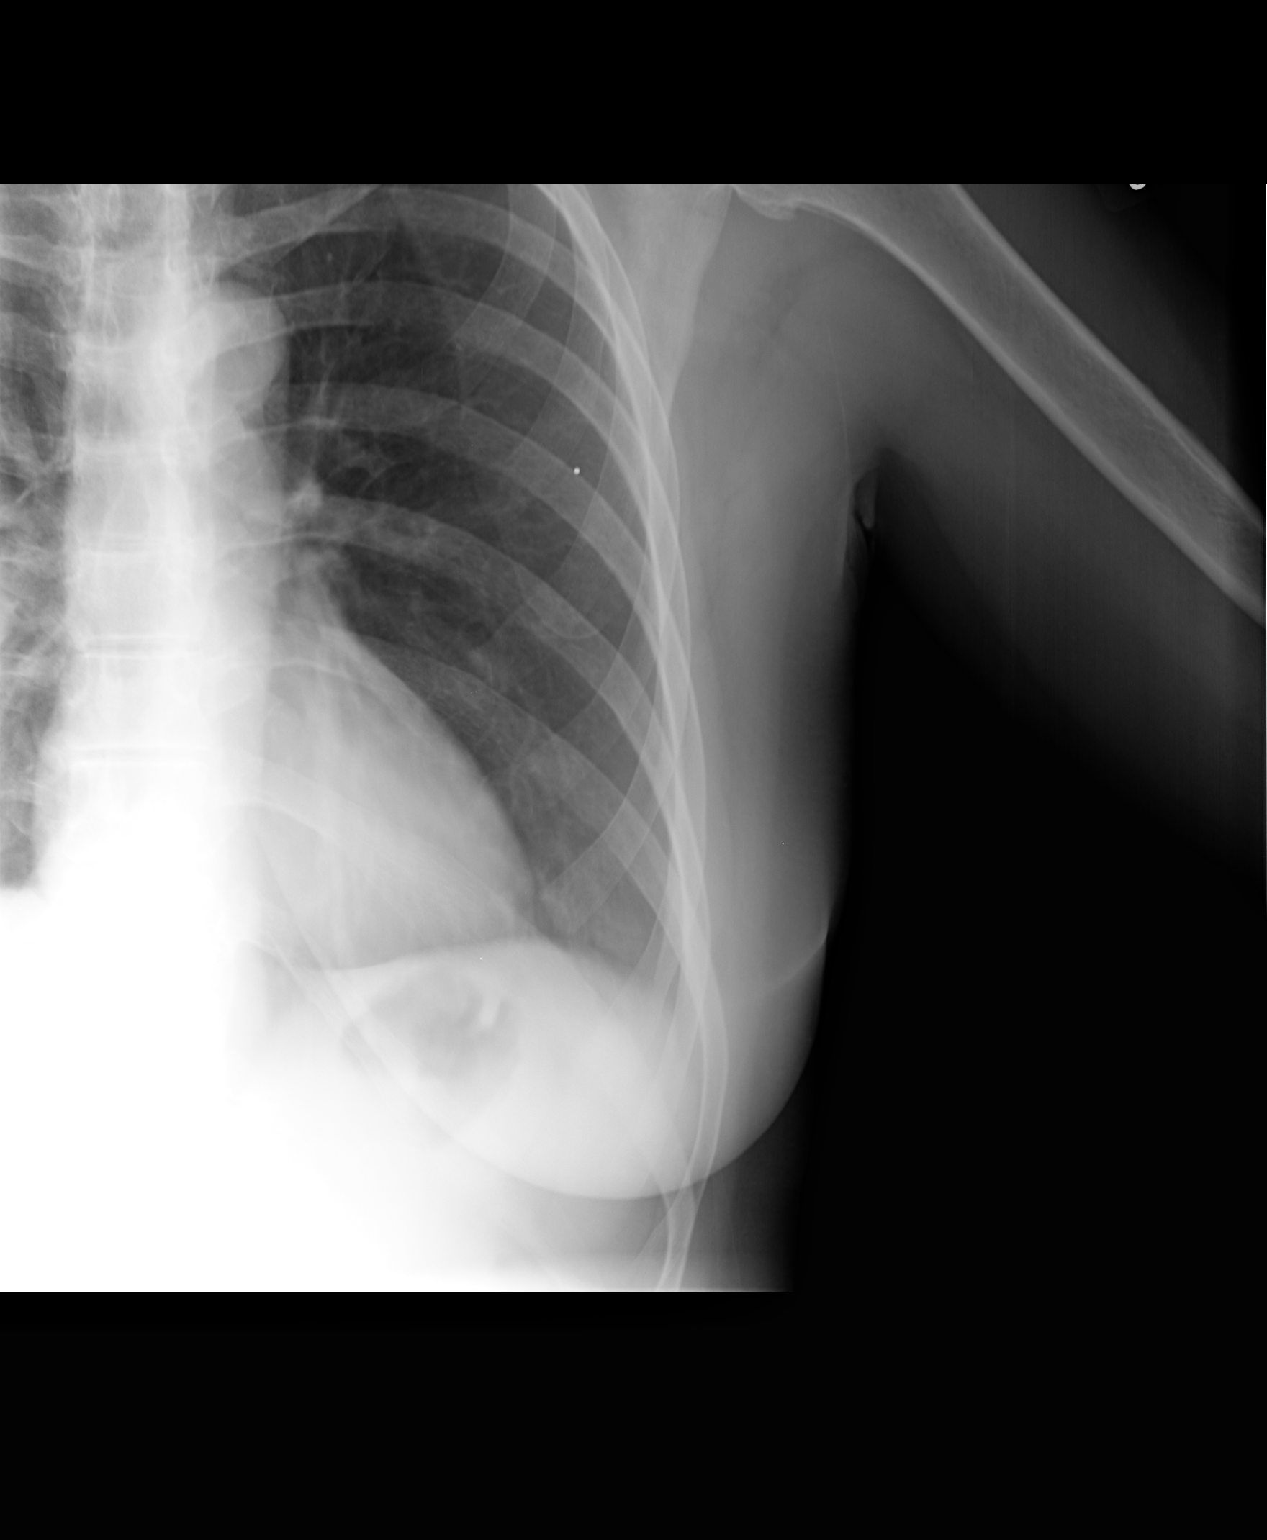

[view not recorded (3 of 3)]
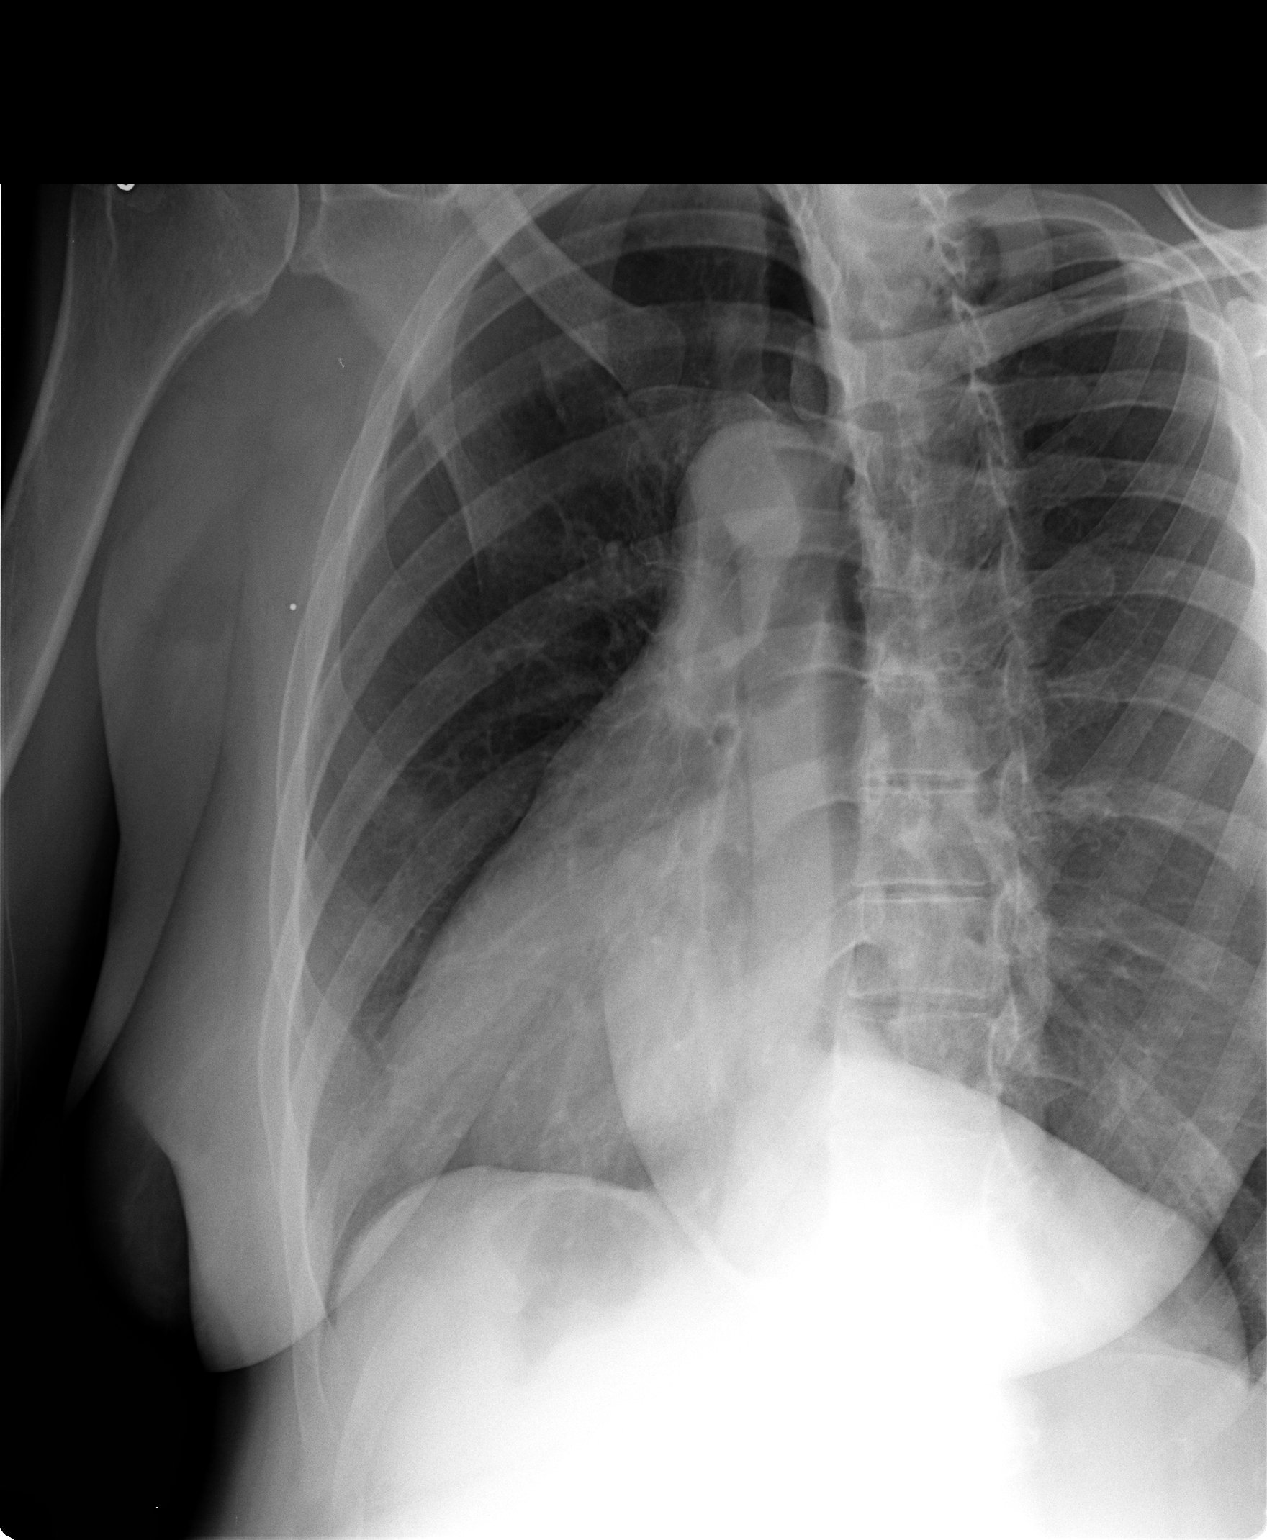

[3 of 3 positions shown; findings below may reference images not displayed]

FINDINGS: The heart size and pulmonary vascularity are normal and
the lungs are clear.  There are no rib fractures or other osseous
abnormalities.  No pneumothorax or pleural effusion.
IMPRESSION: Normal exam.

## 2014-08-20 ENCOUNTER — Emergency Department (INDEPENDENT_AMBULATORY_CARE_PROVIDER_SITE_OTHER)
Admission: EM | Admit: 2014-08-20 | Discharge: 2014-08-20 | Disposition: A | Discharge status: Home / Self Care | Payer: Commercial Managed Care - PPO | Source: Home / Self Care | Attending: Family Medicine | Admitting: Family Medicine

## 2014-08-20 ENCOUNTER — Encounter: Payer: Self-pay | Admitting: *Deleted

## 2014-08-20 DIAGNOSIS — J209 Acute bronchitis, unspecified: Secondary | ICD-10-CM

## 2014-08-20 DIAGNOSIS — J029 Acute pharyngitis, unspecified: Secondary | ICD-10-CM

## 2014-08-20 LAB — POCT RAPID STREP A (OFFICE): RAPID STREP A SCREEN: NEGATIVE

## 2014-08-20 MED ORDER — LIDOCAINE VISCOUS 2 % MT SOLN: 15.0000 mL | Freq: Three times a day (TID) | OROMUCOSAL | Status: DC

## 2014-08-20 MED ORDER — AMOXICILLIN 875 MG PO TABS: 875.0000 mg | ORAL_TABLET | Freq: Two times a day (BID) | ORAL | Status: DC

## 2014-08-20 MED ORDER — BENZONATATE 200 MG PO CAPS: 200.0000 mg | ORAL_CAPSULE | Freq: Every day | ORAL | Status: DC

## 2014-08-20 NOTE — ED Provider Notes (Signed)
CSN: [id number]     Arrival date & time 08/20/14  [id number] History   First MD Initiated Contact with Patient 08/20/14 1025     Chief Complaint  Patient presents with  . Sore Throat  . Cough  . Otalgia      HPI Comments: Four days ago patient developed sore throat, increased sinus congestion, fatigue, chills, and mild cough.  She has seasonal allergies and she thought that her allergic condition was flaring.  Her cough and sore throat have become worse. She continues to smoke.  The history is provided by the patient.    History reviewed. No pertinent past medical history. Past Surgical History  Procedure Laterality Date  . Ectopic pregnancy surgery     Family History  Problem Relation Age of Onset  . Hypertension Father    History  Substance Use Topics  . Smoking status: Current Every Day Smoker -- 1.00 packs/day    Types: Cigarettes  . Smokeless tobacco: Not on file  . Alcohol Use: Yes     Comment: ocassionally   OB History    No data available     Review of Systems + sore throat + hoarseness + cough No pleuritic pain + wheezing + nasal congestion + post-nasal drainage No sinus pain/pressure No itchy/red eyes ? earache No hemoptysis + SOB No fever, + chills No nausea No vomiting No abdominal pain No diarrhea No urinary symptoms No skin rash + fatigue + myalgias + headache Used OTC meds without relief  Allergies  Review of patient's allergies indicates no known allergies.  Home Medications   Prior to Admission medications   Medication Sig Start Date End Date Taking? Authorizing Provider  amoxicillin (AMOXIL) 875 MG tablet Take 1 tablet (875 mg total) by mouth 2 (two) times daily. 08/20/14   Lattie HawStephen A Beese, MD  atorvastatin (LIPITOR) 40 MG tablet Take 1 tablet (40 mg total) by mouth daily. 02/25/13   Monica Bectonhomas J Thekkekandam, MD  benzonatate (TESSALON) 200 MG capsule Take 1 capsule (200 mg total) by mouth at bedtime. Take as needed for cough 08/20/14    Lattie HawStephen A Beese, MD  citalopram (CELEXA) 20 MG tablet Take 1 tablet (20 mg total) by mouth daily. 06/19/13   Monica Bectonhomas J Thekkekandam, MD  lidocaine (XYLOCAINE) 2 % solution Use as directed 15 mLs in the mouth or throat 4 (four) times daily -  before meals and at bedtime. Gargle, then expectorate 08/20/14   Lattie HawStephen A Beese, MD   BP 134/95 mmHg  Pulse 104  Temp(Src) 98.2 F (36.8 C) (Oral)  Resp 20  Wt 150 lb (68.04 kg)  SpO2 96% Physical Exam Nursing notes and Vital Signs reviewed. Appearance:  Patient appears healthy, stated age, and in no acute distress Eyes:  Pupils are equal, round, and reactive to light and accomodation.  Extraocular movement is intact.  Conjunctivae are not inflamed  Ears:  Canals normal.  Tympanic membranes normal.  Nose:   Congested turbinates.  No sinus tenderness.   Pharynx:  Erythematous without exudate Neck:  Supple.  Tender enlarged anterior/posterior nodes are palpated bilaterally  Lungs:  Clear to auscultation.  Breath sounds are equal.  Chest:  Distinct tenderness to palpation over the mid-sternum.  Heart:  Regular rate and rhythm without murmurs, rubs, or gallops.  Abdomen:  Nontender without masses or hepatosplenomegaly.  Bowel sounds are present.  No CVA or flank tenderness.  Extremities:  No edema.  No calf tenderness Skin:  No rash present.   ED Course  Procedures  none    Labs Reviewed  POCT RAPID STREP A (OFFICE) negative      MDM   1. Acute bronchitis, unspecified organism   2. Acute pharyngitis, unspecified pharyngitis type; ?false negative strep    With a history of smoking, will begin empiric amoxicillin.  Prescription written for Benzonatate [facility](Tessalon) to take at bedtime for night-time cough.  Lidocaine viscous for throat pain. Take plain Mucinex (1200 mg guaifenesin) twice daily for cough and congestion.  May add Sudafed for sinus congestion.   Increase fluid intake, rest. May use Afrin nasal spray (or generic oxymetazoline) twice  daily for about 5 days.  Also recommend using saline nasal spray several times daily and saline nasal irrigation (AYR is a common brand) Try warm salt water gargles for sore throat.  Stop all antihistamines for now, and other non-prescription cough/cold preparations. May take Ibuprofen 200mg , 4 tabs every 8 hours with food for sore throat, headache, etc.   Follow-up with family doctor if not improving 7 to 10 days.     Lattie HawStephen A Beese, MD 08/20/14 1057

## 2014-08-20 NOTE — Discharge Instructions (Signed)
Take plain Mucinex (1200 mg guaifenesin) twice daily for cough and congestion.  May add Sudafed for sinus congestion.   Increase fluid intake, rest. May use Afrin nasal spray (or generic oxymetazoline) twice daily for about 5 days.  Also recommend using saline nasal spray several times daily and saline nasal irrigation (AYR is a common brand) Try warm salt water gargles for sore throat.  Stop all antihistamines for now, and other non-prescription cough/cold preparations. May take Ibuprofen 200mg , 4 tabs every 8 hours with food for sore throat, headache, etc.   Follow-up with family doctor if not improving 7 to 10 days.

## 2014-08-20 NOTE — ED Notes (Signed)
Teresa PearsonFranky c/o sore throat, painful swallowing, bilateral ear pain, eye pain and productive cough x 3 days. Denies fever. No flu vac this season.

## 2014-10-09 ENCOUNTER — Ambulatory Visit (INDEPENDENT_AMBULATORY_CARE_PROVIDER_SITE_OTHER): Payer: Commercial Managed Care - PPO | Admitting: Sports Medicine

## 2014-10-09 ENCOUNTER — Encounter: Payer: Self-pay | Admitting: Sports Medicine

## 2014-10-09 DIAGNOSIS — F321 Major depressive disorder, single episode, moderate: Secondary | ICD-10-CM

## 2014-10-09 MED ORDER — VENLAFAXINE HCL ER 75 MG PO CP24: 75.0000 mg | ORAL_CAPSULE | Freq: Every day | ORAL | Status: DC

## 2014-10-09 MED ORDER — VENLAFAXINE HCL ER 37.5 MG PO CP24: ORAL_CAPSULE | ORAL | Status: DC

## 2014-10-09 NOTE — Assessment & Plan Note (Addendum)
Discontinue Celexa, starting Effexor. 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 I would like her to also talk to psychiatry for both an M.D. visit and psychotherapy.

## 2014-10-09 NOTE — Progress Notes (Signed)
  Subjective:    CC: Mood  HPI: This is a pleasant 49 year old female, she comes in with a several month history of worsening mood, and increasing confusion. She does endorse severe loss of interest and anhedonia, severe depressed mood, severe difficulty sleeping, severe lack of energy, moderate poor appetite, severe guilt, moderate trouble concentrating, and moderate psychomotor retardation. She has thought about hurting herself or that she would be better off dead however tells me she would never act on any of these thoughts. She is amenable to try both psychotherapy and medication.  Past medical history, Surgical history, Family history not pertinant except as noted below, Social history, Allergies, and medications have been entered into the medical record, reviewed, and no changes needed.   Review of Systems: No fevers, chills, night sweats, weight loss, chest pain, or shortness of breath.   Objective:    General: Well Developed, well nourished, and in no acute distress.  Neuro: Alert and oriented x3, extra-ocular muscles intact, sensation grossly intact.  HEENT: Normocephalic, atraumatic, pupils equal round reactive to light, neck supple, no masses, no lymphadenopathy, thyroid nonpalpable.  Skin: Warm and dry, no rashes. Cardiac: Regular rate and rhythm, no murmurs rubs or gallops, no lower extremity edema.  Respiratory: Clear to auscultation bilaterally. Not using accessory muscles, speaking in full sentences.  Impression and Recommendations:

## 2014-10-21 ENCOUNTER — Telehealth: Payer: Self-pay

## 2014-10-21 DIAGNOSIS — F331 Major depressive disorder, recurrent, moderate: Secondary | ICD-10-CM

## 2014-10-21 NOTE — Telephone Encounter (Signed)
Referral placed.

## 2014-10-21 NOTE — Telephone Encounter (Signed)
Patient called requested a referral for Psychiatry and Psychotherapy that was discussed on the 10/09/2014 OV. Rhonda Cunningham,CMA

## 2014-10-21 NOTE — Telephone Encounter (Signed)
Patient has been informed the referrals have placed. Genella Bas,CMA

## 2014-10-23 ENCOUNTER — Encounter (HOSPITAL_COMMUNITY): Payer: Self-pay | Admitting: Psychiatry

## 2014-10-23 ENCOUNTER — Ambulatory Visit (INDEPENDENT_AMBULATORY_CARE_PROVIDER_SITE_OTHER): Payer: 59 | Admitting: Psychiatry

## 2014-10-23 VITALS — BP 140/90 | HR 75 | Ht 67.0 in | Wt 145.0 lb

## 2014-10-23 DIAGNOSIS — F331 Major depressive disorder, recurrent, moderate: Secondary | ICD-10-CM

## 2014-10-23 DIAGNOSIS — F411 Generalized anxiety disorder: Secondary | ICD-10-CM

## 2014-10-23 DIAGNOSIS — F321 Major depressive disorder, single episode, moderate: Secondary | ICD-10-CM

## 2014-10-23 MED ORDER — VENLAFAXINE HCL ER 75 MG PO CP24: 75.0000 mg | ORAL_CAPSULE | Freq: Two times a day (BID) | ORAL | Status: DC

## 2014-10-23 NOTE — Patient Instructions (Signed)
Increase effexor 75mg  twice a day.
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
## 2014-10-24 LAB — TSH: TSH: 1.458 u[IU]/mL (ref 0.350–4.500)

## 2014-11-06 ENCOUNTER — Ambulatory Visit: Payer: Commercial Managed Care - PPO | Admitting: Sports Medicine

## 2014-11-06 ENCOUNTER — Ambulatory Visit (HOSPITAL_COMMUNITY): Payer: Self-pay | Admitting: Psychiatry

## 2014-11-11 ENCOUNTER — Ambulatory Visit (HOSPITAL_COMMUNITY): Payer: Self-pay | Admitting: Psychiatry

## 2014-11-18 ENCOUNTER — Ambulatory Visit (INDEPENDENT_AMBULATORY_CARE_PROVIDER_SITE_OTHER): Payer: 59 | Admitting: Psychiatry

## 2014-11-18 ENCOUNTER — Ambulatory Visit (INDEPENDENT_AMBULATORY_CARE_PROVIDER_SITE_OTHER): Payer: Commercial Managed Care - PPO | Admitting: Sports Medicine

## 2014-11-18 ENCOUNTER — Encounter (HOSPITAL_COMMUNITY): Payer: Self-pay | Admitting: Psychiatry

## 2014-11-18 ENCOUNTER — Encounter: Payer: Self-pay | Admitting: Sports Medicine

## 2014-11-18 VITALS — BP 138/78 | HR 81 | Ht 67.0 in | Wt 149.0 lb

## 2014-11-18 DIAGNOSIS — F321 Major depressive disorder, single episode, moderate: Secondary | ICD-10-CM

## 2014-11-18 DIAGNOSIS — F411 Generalized anxiety disorder: Secondary | ICD-10-CM

## 2014-11-18 MED ORDER — ARIPIPRAZOLE 5 MG PO TABS: 5.0000 mg | ORAL_TABLET | Freq: Every day | ORAL | Status: DC

## 2014-11-18 NOTE — Progress Notes (Signed)
  Subjective:    CC: Follow-up  HPI: This pleasant 49 year old female returns to discuss her major depressive disorder, we started venlafaxine at the last visit, she was seen by psychiatry and this was increased to 150 mg, she does tell me she is worse at 150. She does continue to endorse moderate anhedonia, depressed mood, severe difficulty sleeping, severe lack of energy, mild change in appetite, moderate guilt, and moderate difficulty concentrating, only mild psychomotor retardation. No suicidal or homicidal ideation.  Past medical history, Surgical history, Family history not pertinant except as noted below, Social history, Allergies, and medications have been entered into the medical record, reviewed, and no changes needed.   Review of Systems: No fevers, chills, night sweats, weight loss, chest pain, or shortness of breath.   Objective:    General: Well Developed, well nourished, and in no acute distress.  Neuro: Alert and oriented x3, extra-ocular muscles intact, sensation grossly intact.  HEENT: Normocephalic, atraumatic, pupils equal round reactive to light, neck supple, no masses, no lymphadenopathy, thyroid nonpalpable.  Skin: Warm and dry, no rashes. Cardiac: Regular rate and rhythm, no murmurs rubs or gallops, no lower extremity edema.  Respiratory: Clear to auscultation bilaterally. Not using accessory muscles, speaking in full sentences.  Impression and Recommendations:
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
## 2014-11-18 NOTE — Assessment & Plan Note (Signed)
Improved on 75 of venlafaxine however somewhat worsened 150. Decreasing back to 75 and I'm going to add Abilify 5 for augmentation.  Return to see me in one month. Medical workup was unremarkable.

## 2015-01-02 ENCOUNTER — Ambulatory Visit (HOSPITAL_COMMUNITY): Payer: Self-pay | Admitting: Psychiatry

## 2015-01-06 ENCOUNTER — Ambulatory Visit (INDEPENDENT_AMBULATORY_CARE_PROVIDER_SITE_OTHER): Payer: Commercial Managed Care - PPO | Admitting: Sports Medicine

## 2015-01-06 ENCOUNTER — Encounter: Payer: Self-pay | Admitting: Sports Medicine

## 2015-01-06 VITALS — BP 142/94 | HR 87 | Ht 67.0 in | Wt 152.0 lb

## 2015-01-06 DIAGNOSIS — F320 Major depressive disorder, single episode, mild: Secondary | ICD-10-CM

## 2015-01-06 DIAGNOSIS — M5030 Other cervical disc degeneration, unspecified cervical region: Secondary | ICD-10-CM | POA: Diagnosis not present

## 2015-01-06 DIAGNOSIS — M1990 Unspecified osteoarthritis, unspecified site: Secondary | ICD-10-CM | POA: Diagnosis not present

## 2015-01-06 DIAGNOSIS — F321 Major depressive disorder, single episode, moderate: Secondary | ICD-10-CM

## 2015-01-06 DIAGNOSIS — M19049 Primary osteoarthritis, unspecified hand: Secondary | ICD-10-CM

## 2015-01-06 MED ORDER — ARIPIPRAZOLE 10 MG PO TABS: 10.0000 mg | ORAL_TABLET | Freq: Every day | ORAL | Status: DC

## 2015-01-06 MED ORDER — TRAMADOL HCL 50 MG PO TABS: ORAL_TABLET | ORAL | Status: DC

## 2015-01-06 NOTE — Assessment & Plan Note (Signed)
Overall doing much better, continue 75 mg of Effexor. Increasing Abilify to 10 mg. Return to see either myself or Dr. Gilmore LarocheAkhtar in one month.

## 2015-01-06 NOTE — Assessment & Plan Note (Signed)
Bilateral injection, previous injection gave over 6 months of response. Return to see me in one month.

## 2015-01-06 NOTE — Assessment & Plan Note (Signed)
Needs a refill on tramadol

## 2015-01-06 NOTE — Progress Notes (Signed)
  Subjective:    CC: Follow-up  HPI: Depression: Continues to improve significantly, all symptoms have decreased. No suicidal or homicidal ideation.  Cervical degenerative disc disease: Persistent pain, needs a refill on tramadol.  Bilateral carpometacarpal arthritis: Previous injections were over 6 months ago, desires a repeat.  Past medical history, Surgical history, Family history not pertinant except as noted below, Social history, Allergies, and medications have been entered into the medical record, reviewed, and no changes needed.   Review of Systems: No fevers, chills, night sweats, weight loss, chest pain, or shortness of breath.   Objective:    General: Well Developed, well nourished, and in no acute distress.  Neuro: Alert and oriented x3, extra-ocular muscles intact, sensation grossly intact.  HEENT: Normocephalic, atraumatic, pupils equal round reactive to light, neck supple, no masses, no lymphadenopathy, thyroid nonpalpable.  Skin: Warm and dry, no rashes. Cardiac: Regular rate and rhythm, no murmurs rubs or gallops, no lower extremity edema.  Respiratory: Clear to auscultation bilaterally. Not using accessory muscles, speaking in full sentences.  Procedure: Real-time Ultrasound Guided Injection of right trapeziometacarpal joint Device: GE Logiq E  Verbal informed consent obtained.  Time-out conducted.  Noted no overlying erythema, induration, or other signs of local infection.  Skin prepped in a sterile fashion.  Local anesthesia: Topical Ethyl chloride.  With sterile technique and under real time ultrasound guidance:  0.5 mL kenalog 40, 0.5 mL lidocaine injected easily. Completed without difficulty  Pain immediately resolved suggesting accurate placement of the medication.  Advised to call if fevers/chills, erythema, induration, drainage, or persistent bleeding.  Images permanently stored and available for review in the ultrasound unit.  Impression: Technically  successful ultrasound guided injection.  Procedure: Real-time Ultrasound Guided Injection of left trapeziometacarpal joint Device: GE Logiq E  Verbal informed consent obtained.  Time-out conducted.  Noted no overlying erythema, induration, or other signs of local infection.  Skin prepped in a sterile fashion.  Local anesthesia: Topical Ethyl chloride.  With sterile technique and under real time ultrasound guidance:  0.5 mL kenalog 40, 0.5 mL lidocaine injected easily. Completed without difficulty  Pain immediately resolved suggesting accurate placement of the medication.  Advised to call if fevers/chills, erythema, induration, drainage, or persistent bleeding.  Images permanently stored and available for review in the ultrasound unit.  Impression: Technically successful ultrasound guided injection.  Impression and Recommendations:

## 2015-01-09 ENCOUNTER — Encounter: Payer: Self-pay | Admitting: Emergency Medicine

## 2015-01-09 ENCOUNTER — Emergency Department (INDEPENDENT_AMBULATORY_CARE_PROVIDER_SITE_OTHER)
Admission: EM | Admit: 2015-01-09 | Discharge: 2015-01-09 | Disposition: A | Discharge status: Home / Self Care | Payer: Commercial Managed Care - PPO | Source: Home / Self Care | Attending: Family Medicine | Admitting: Family Medicine

## 2015-01-09 DIAGNOSIS — M25531 Pain in right wrist: Secondary | ICD-10-CM | POA: Diagnosis not present

## 2015-01-09 MED ORDER — OXYCODONE-ACETAMINOPHEN 5-325 MG PO TABS: 1.0000 | ORAL_TABLET | Freq: Four times a day (QID) | ORAL | Status: DC | PRN

## 2015-01-09 MED ORDER — PREGABALIN 50 MG PO CAPS: 50.0000 mg | ORAL_CAPSULE | Freq: Three times a day (TID) | ORAL | Status: DC

## 2015-01-09 NOTE — ED Notes (Signed)
[facility] pain in the right wrist after an injection on Tuesday. c/o burning sensation 8/10.

## 2015-01-09 NOTE — Discharge Instructions (Signed)
Stop Tramadol.  Begin wearing wrist splint after pain decreases.  Elevate right hand when possible.  Return for development of warmth, redness, increased swelling.

## 2015-01-09 NOTE — ED Provider Notes (Signed)
CSN: [id number]     Arrival date & time 01/09/15  1139 History   First MD Initiated Contact with Patient 01/09/15 1216     Chief Complaint  Patient presents with  . Wrist Pain      HPI Comments: Patient has a history of carpometacarpal joint arthritis, and underwent corticosteroid injection of her right trapeziometacarpal joint three days ago.  She complains of persistent burning/electric shock-like pain over her right wrist and distal forearm that became especially worse at 4am today.  She has significant tenderness to palpation with only the lightest touch, and her pain has not responded to tramadol.  Patient is a 49 y.o. female presenting with wrist pain. The history is provided by the patient.  Wrist Pain This is a new problem. Episode onset: 3 days ago. The problem occurs constantly. The problem has been gradually worsening. Associated symptoms comments: No swelling. Exacerbated by: wrist movement. Nothing relieves the symptoms. Treatments tried: tramadol. The treatment provided no relief.    History reviewed. No pertinent past medical history. Past Surgical History  Procedure Laterality Date  . Ectopic pregnancy surgery     Family History  Problem Relation Age of Onset  . Hypertension Father   . Depression Sister    History  Substance Use Topics  . Smoking status: Current Every Day Smoker -- 1.00 packs/day    Types: Cigarettes  . Smokeless tobacco: Not on file  . Alcohol Use: Yes     Comment: ocassionally   OB History    No data available     Review of Systems  All other systems reviewed and are negative.   Allergies  Review of patient's allergies indicates no known allergies.  Home Medications   Prior to Admission medications   Medication Sig Start Date End Date Taking? Authorizing Provider  ARIPiprazole (ABILIFY) 10 MG tablet Take 1 tablet (10 mg total) by mouth daily. 01/06/15   Monica Bectonhomas J Thekkekandam, MD  oxyCODONE-acetaminophen (ROXICET) 5-325 MG per tablet  Take 1 tablet by mouth every 6 (six) hours as needed for severe pain. 01/09/15   Lattie HawStephen A Beese, MD  pregabalin (LYRICA) 50 MG capsule Take 1 capsule (50 mg total) by mouth 3 (three) times daily. 01/09/15   Lattie HawStephen A Beese, MD  traMADol (ULTRAM) 50 MG tablet 1-2 tabs by mouth Q8 hours, maximum 6 tabs per day. 01/06/15   Monica Bectonhomas J Thekkekandam, MD  venlafaxine XR (EFFEXOR XR) 75 MG 24 hr capsule Take 1 capsule (75 mg total) by mouth daily with breakfast. 11/18/14   Monica Bectonhomas J Thekkekandam, MD   BP 138/98 mmHg  Pulse 87  Temp(Src) 98.2 F (36.8 C) (Oral)  Resp 16  Ht 5\' 7"  (1.702 m)  Wt 149 lb 12.8 oz (67.949 kg)  BMI 23.46 kg/m2  SpO2 98% Physical Exam  Constitutional: She is oriented to person, place, and time. She appears well-developed and well-nourished. No distress.  Eyes: Pupils are equal, round, and reactive to light.  Musculoskeletal:       Right wrist: She exhibits decreased range of motion and tenderness. She exhibits no bony tenderness, no swelling, no effusion and no crepitus.       Arms: Patient has diffuse tenderness to palpation over distal radial forearm and thumb as noted on diagram.  There is no swelling, erythema, or warmth.  Her range of motion is limited by pain.    Neurological: She is alert and oriented to person, place, and time.  Skin: Skin is warm and dry. No erythema.  Nursing note and vitals reviewed.   ED Course  Procedures none   MDM   1. Wrist pain, acute, right; suspect neuropathic    Thumb spica splint fitted. Begin Lyrica  TID.  Percocet 5-325 Q6hr prn pain.  Dispensed thumb spica splint. Stop Tramadol.  Begin wearing wrist splint after pain decreases.  Elevate right hand when possible.  Return for development of warmth, redness, increased swelling. Followup with Dr. Rodney [facility] ([facility] in about 11 days.    Lattie Haw, MD 01/12/15 1026

## 2015-01-12 ENCOUNTER — Telehealth: Payer: Self-pay | Admitting: Emergency Medicine

## 2015-01-22 ENCOUNTER — Encounter: Payer: Self-pay | Admitting: Sports Medicine

## 2015-01-22 ENCOUNTER — Ambulatory Visit (INDEPENDENT_AMBULATORY_CARE_PROVIDER_SITE_OTHER): Payer: Commercial Managed Care - PPO | Admitting: Sports Medicine

## 2015-01-22 DIAGNOSIS — M19049 Primary osteoarthritis, unspecified hand: Secondary | ICD-10-CM

## 2015-01-22 DIAGNOSIS — M1990 Unspecified osteoarthritis, unspecified site: Secondary | ICD-10-CM

## 2015-01-22 MED ORDER — PREGABALIN 50 MG PO CAPS: 50.0000 mg | ORAL_CAPSULE | Freq: Two times a day (BID) | ORAL | Status: DC

## 2015-01-22 MED ORDER — PREDNISONE 50 MG PO TABS: ORAL_TABLET | ORAL | Status: DC

## 2015-01-22 MED ORDER — DICLOFENAC SODIUM 2 % TD SOLN: 2.0000 | Freq: Two times a day (BID) | TRANSDERMAL | Status: DC

## 2015-01-22 NOTE — Progress Notes (Signed)
  Subjective:    CC: Follow-up  HPI: This pleasant 49 year old female returns, we did a right trapeziometacarpal injection at the last visit, she had some increasing pain that felt more like paresthesias with some hypoesthesia over the dorsum of her thumb, however the arthritic type pain has resolved. She was seen in urgent care and referred back to me for further evaluation and definitive treatment. Symptoms are continuing to improve. She has been unable to wear a spica brace due to pain.  Past medical history, Surgical history, Family history not pertinant except as noted below, Social history, Allergies, and medications have been entered into the medical record, reviewed, and no changes needed.   Review of Systems: No fevers, chills, night sweats, weight loss, chest pain, or shortness of breath.   Objective:    General: Well Developed, well nourished, and in no acute distress.  Neuro: Alert and oriented x3, extra-ocular muscles intact, sensation grossly intact.  HEENT: Normocephalic, atraumatic, pupils equal round reactive to light, neck supple, no masses, no lymphadenopathy, thyroid nonpalpable.  Skin: Warm and dry, no rashes. Cardiac: Regular rate and rhythm, no murmurs rubs or gallops, no lower extremity edema.  Respiratory: Clear to auscultation bilaterally. Not using accessory muscles, speaking in full sentences. Right Wrist: Inspection normal with no visible erythema or swelling. ROM smooth and normal with good flexion and extension and ulnar/radial deviation that is symmetrical with opposite wrist. Tender to palpation over the trapeziometacarpal joint, no swelling, no erythema. No snuffbox tenderness. No tenderness over Canal of [place]. Strength 5/5 in all directions without pain. Negative Finkelstein, tinel's and phalens. Negative Watson's test.  Impression and Recommendations:

## 2015-01-22 NOTE — Assessment & Plan Note (Signed)
Doing extremely well on the left side. Right side has some numbness and tingling suggestive of a neurapraxia. This is resolving, continue icing, adding Pennsaid, Lyrica, and prednisone for 5 days. Return to see me in 2-3 weeks regarding this. Arthritis type pain has resolved.

## 2015-02-03 ENCOUNTER — Ambulatory Visit: Payer: Self-pay | Admitting: Sports Medicine

## 2015-02-24 ENCOUNTER — Other Ambulatory Visit: Payer: Self-pay | Admitting: *Deleted

## 2015-02-24 DIAGNOSIS — M5030 Other cervical disc degeneration, unspecified cervical region: Secondary | ICD-10-CM

## 2015-02-24 MED ORDER — TRAMADOL HCL 50 MG PO TABS: ORAL_TABLET | ORAL | Status: DC

## 2015-04-20 ENCOUNTER — Other Ambulatory Visit: Payer: Self-pay | Admitting: Sports Medicine

## 2015-04-21 ENCOUNTER — Other Ambulatory Visit: Payer: Self-pay | Admitting: Sports Medicine

## 2017-02-11 ENCOUNTER — Encounter (HOSPITAL_BASED_OUTPATIENT_CLINIC_OR_DEPARTMENT_OTHER): Payer: Self-pay | Admitting: Emergency Medicine

## 2017-02-11 ENCOUNTER — Emergency Department (HOSPITAL_BASED_OUTPATIENT_CLINIC_OR_DEPARTMENT_OTHER): Payer: Self-pay

## 2017-02-11 DIAGNOSIS — F17210 Nicotine dependence, cigarettes, uncomplicated: Secondary | ICD-10-CM | POA: Insufficient documentation

## 2017-02-11 DIAGNOSIS — Y9301 Activity, walking, marching and hiking: Secondary | ICD-10-CM | POA: Insufficient documentation

## 2017-02-11 DIAGNOSIS — Y9289 Other specified places as the place of occurrence of the external cause: Secondary | ICD-10-CM | POA: Insufficient documentation

## 2017-02-11 DIAGNOSIS — Y998 Other external cause status: Secondary | ICD-10-CM | POA: Insufficient documentation

## 2017-02-11 DIAGNOSIS — Z79899 Other long term (current) drug therapy: Secondary | ICD-10-CM | POA: Insufficient documentation

## 2017-02-11 DIAGNOSIS — W010XXA Fall on same level from slipping, tripping and stumbling without subsequent striking against object, initial encounter: Secondary | ICD-10-CM | POA: Insufficient documentation

## 2017-02-11 DIAGNOSIS — S20219A Contusion of unspecified front wall of thorax, initial encounter: Secondary | ICD-10-CM | POA: Insufficient documentation

## 2017-02-11 NOTE — ED Triage Notes (Signed)
Patient states that she fell and hit her left side into a cement pillar. The patient reports left rib pain

## 2017-02-12 ENCOUNTER — Emergency Department (HOSPITAL_BASED_OUTPATIENT_CLINIC_OR_DEPARTMENT_OTHER): Payer: Self-pay

## 2017-02-12 ENCOUNTER — Emergency Department (HOSPITAL_BASED_OUTPATIENT_CLINIC_OR_DEPARTMENT_OTHER)
Admission: EM | Admit: 2017-02-12 | Discharge: 2017-02-12 | Disposition: A | Discharge status: Home / Self Care | Payer: Self-pay | Attending: Emergency Medicine | Admitting: Emergency Medicine

## 2017-02-12 DIAGNOSIS — S20212A Contusion of left front wall of thorax, initial encounter: Secondary | ICD-10-CM

## 2017-02-12 DIAGNOSIS — W19XXXA Unspecified fall, initial encounter: Secondary | ICD-10-CM

## 2017-02-12 LAB — URINALYSIS, ROUTINE W REFLEX MICROSCOPIC
Bilirubin Urine: NEGATIVE
GLUCOSE, UA: NEGATIVE mg/dL
HGB URINE DIPSTICK: NEGATIVE
Ketones, ur: NEGATIVE mg/dL
Leukocytes, UA: NEGATIVE
Nitrite: NEGATIVE
PH: 6 (ref 5.0–8.0)
Protein, ur: NEGATIVE mg/dL
Specific Gravity, Urine: 1.009 (ref 1.005–1.030)

## 2017-02-12 MED ORDER — HYDROCODONE-ACETAMINOPHEN 5-325 MG PO TABS
2.0000 | ORAL_TABLET | Freq: Once | ORAL | Status: AC
Start: 2017-02-12 — End: 2017-02-12
  Administered 2017-02-12: 2 via ORAL
  Filled 2017-02-12: qty 2

## 2017-02-12 MED ORDER — IBUPROFEN 800 MG PO TABS: 800.0000 mg | ORAL_TABLET | Freq: Three times a day (TID) | ORAL | 0 refills | Status: DC | PRN

## 2017-02-12 MED ORDER — HYDROCODONE-ACETAMINOPHEN 5-325 MG PO TABS: 1.0000 | ORAL_TABLET | Freq: Four times a day (QID) | ORAL | 0 refills | Status: DC | PRN

## 2017-02-12 NOTE — ED Notes (Signed)
Patient transported to X-ray 

## 2017-02-12 NOTE — ED Notes (Signed)
Pt given d/c instructions as per chart. Rx x 2 with precautions. Verbalizes understanding. No questions. 

## 2017-02-12 NOTE — ED Provider Notes (Signed)
TIME SEEN: 1:10 AM  CHIEF COMPLAINT: Fall  HPI: Patient is a 51 year old female who presents to the emergency department after she lost her balance and fell onto a concrete pillar with the left side of her back last night around 6 PM. No head injury or loss of consciousness. Complaining of left rib pain, thoracic and lumbar back pain and posterior left hip pain. Did not take any medications prior to arrival. Not on blood thinners. No hematuria.  ROS: See HPI Constitutional: no fever  Eyes: no drainage  ENT: no runny nose   Cardiovascular:  no chest pain  Resp: no SOB  GI: no vomiting GU: no dysuria Integumentary: no rash  Allergy: no hives  Musculoskeletal: no leg swelling  Neurological: no slurred speech ROS otherwise negative  PAST MEDICAL HISTORY/PAST SURGICAL HISTORY:  History reviewed. No pertinent past medical history.  MEDICATIONS:  Prior to Admission medications   Medication Sig Start Date End Date Taking? Authorizing Provider  ARIPiprazole (ABILIFY) 10 MG tablet Take 1 tablet (10 mg total) by mouth daily. 01/06/15   Monica Bectonhekkekandam, Thomas J, MD  Diclofenac Sodium 2 % SOLN Place 2 sprays onto the skin 2 (two) times daily. 01/22/15   Monica Bectonhekkekandam, Thomas J, MD  oxyCODONE-acetaminophen (ROXICET) 5-325 MG per tablet Take 1 tablet by mouth every 6 (six) hours as needed for severe pain. 01/09/15   Lattie HawBeese, Stephen A, MD  predniSONE (DELTASONE) 50 MG tablet One tab PO daily for 5 days. 01/22/15   Monica Bectonhekkekandam, Thomas J, MD  pregabalin (LYRICA) 50 MG capsule Take 1 capsule (50 mg total) by mouth 2 (two) times daily. 01/22/15   Monica Bectonhekkekandam, Thomas J, MD  traMADol (ULTRAM) 50 MG tablet TAKE ONE TO TWO TABLETS BY MOUTH EVERY 8 HOURS....MAX OF 6 TABLETS BY MOUTH DAILY 04/20/15   Monica Bectonhekkekandam, Thomas J, MD  venlafaxine XR (EFFEXOR XR) 75 MG 24 hr capsule Take 1 capsule (75 mg total) by mouth daily with breakfast. 11/18/14   Monica Bectonhekkekandam, Thomas J, MD    ALLERGIES:  No Known Allergies  SOCIAL  HISTORY:  Social History  Substance Use Topics  . Smoking status: Current Every Day Smoker    Packs/day: 1.00    Types: Cigarettes  . Smokeless tobacco: Never Used  . Alcohol use Yes     Comment: ocassionally    FAMILY HISTORY: Family History  Problem Relation Age of Onset  . Hypertension Father   . Depression Sister     EXAM: BP (!) 149/109 (BP Location: Right Arm)   Pulse 98   Temp 99 F (37.2 C) (Oral)   Resp (!) 24   Ht 5\' 7"  (1.702 m)   Wt 150 lb (68 kg)   SpO2 100%   BMI 23.49 kg/m  CONSTITUTIONAL: Alert and oriented and responds appropriately to questions. Well-appearing; well-nourished; GCS 15 HEAD: Normocephalic; atraumatic EYES: Conjunctivae clear, PERRL, EOMI ENT: normal nose; no rhinorrhea; moist mucous membranes; pharynx without lesions noted; no dental injury; no septal hematoma NECK: Supple, no meningismus, no LAD; no midline spinal tenderness, step-off or deformity; trachea midline CARD: RRR; S1 and S2 appreciated; no murmurs, no clicks, no rubs, no gallops RESP: Normal chest excursion without splinting or tachypnea; breath sounds clear and equal bilaterally; no wheezes, no rhonchi, no rales; no hypoxia or respiratory distress CHEST:  chest wall stable, no crepitus or ecchymosis or deformity, tender over the left lateral chest wall; no flail chest ABD/GI: Normal bowel sounds; non-distended; soft, non-tender, no rebound, no guarding; no ecchymosis or other lesions  noted PELVIS:  stable, nontender to palpation BACK:  Patient has abrasion noted to the left thoracic region. She has thoracic and lumbar midline spinal tenderness but no step-off or deformity. No ecchymosis or swelling noted. EXT: Tender to palpation over the posterior left iliac crest. No leg length discrepancy. Normal ROM in all joints; non-tender to palpation; no edema; normal capillary refill; no cyanosis, no bony tenderness or bony deformity of patient's extremities, no joint effusion,  compartments are soft, extremities are warm and well-perfused, no ecchymosis SKIN: Normal color for age and race; warm NEURO: Moves all extremities equally, sensation to light touch intact diffusely, cranial nerves II through XII intact, normal gait PSYCH: The patient's mood and manner are appropriate. Grooming and personal hygiene are appropriate.  MEDICAL DECISION MAKING: Patient here with mechanical fall with complaints of left rib pain, back pain. X-rays of the ribs, thoracic and lumbar spine, pelvis are unremarkable. Urine shows no microscopic or gross hematuria. Pain is improved with Vicodin. We'll discharge with prescription for the same. Discussed return precautions. Patient comfortable with this plan. Hemodynamically stable, neurologically intact.  At this time, I do not feel there is any life-threatening condition present. I have reviewed and discussed all results (EKG, imaging, lab, urine as appropriate) and exam findings with patient/family. I have reviewed nursing notes and appropriate previous records.  I feel the patient is safe to be discharged home without further emergent workup and can continue workup as an outpatient as needed. Discussed usual and customary return precautions. Patient/family verbalize understanding and are comfortable with this plan.  Outpatient follow-up has been provided if needed. All questions have been answered.      Royce Stegman, Layla Maw, DO 02/12/17 [id number]

## 2017-09-20 ENCOUNTER — Emergency Department (HOSPITAL_BASED_OUTPATIENT_CLINIC_OR_DEPARTMENT_OTHER)
Admission: EM | Admit: 2017-09-20 | Discharge: 2017-09-20 | Disposition: A | Discharge status: Home / Self Care | Payer: Self-pay | Attending: Emergency Medicine | Admitting: Emergency Medicine

## 2017-09-20 ENCOUNTER — Encounter (HOSPITAL_BASED_OUTPATIENT_CLINIC_OR_DEPARTMENT_OTHER): Payer: Self-pay

## 2017-09-20 ENCOUNTER — Other Ambulatory Visit: Payer: Self-pay

## 2017-09-20 DIAGNOSIS — Z5321 Procedure and treatment not carried out due to patient leaving prior to being seen by health care provider: Secondary | ICD-10-CM | POA: Insufficient documentation

## 2017-09-20 DIAGNOSIS — T7840XA Allergy, unspecified, initial encounter: Secondary | ICD-10-CM | POA: Insufficient documentation

## 2017-09-20 NOTE — ED Triage Notes (Addendum)
C/o swelling/bleeding to lips after using new lipstick on 12/24-NAD-steady gait

## 2017-09-20 NOTE — ED Notes (Signed)
Attempted to locate patient to place in FT.  Called for pt 4 different times, looking carefully in waiting room.  Pt has left the building.

## 2017-09-21 ENCOUNTER — Encounter (HOSPITAL_BASED_OUTPATIENT_CLINIC_OR_DEPARTMENT_OTHER): Payer: Self-pay

## 2017-09-21 ENCOUNTER — Other Ambulatory Visit: Payer: Self-pay

## 2017-09-21 ENCOUNTER — Emergency Department (HOSPITAL_BASED_OUTPATIENT_CLINIC_OR_DEPARTMENT_OTHER)
Admission: EM | Admit: 2017-09-21 | Discharge: 2017-09-21 | Disposition: A | Discharge status: Home / Self Care | Payer: Self-pay | Attending: Physician Assistant | Admitting: Physician Assistant

## 2017-09-21 DIAGNOSIS — L232 Allergic contact dermatitis due to cosmetics: Secondary | ICD-10-CM | POA: Insufficient documentation

## 2017-09-21 DIAGNOSIS — Z79899 Other long term (current) drug therapy: Secondary | ICD-10-CM | POA: Insufficient documentation

## 2017-09-21 DIAGNOSIS — F17210 Nicotine dependence, cigarettes, uncomplicated: Secondary | ICD-10-CM | POA: Insufficient documentation

## 2017-09-21 DIAGNOSIS — T7840XA Allergy, unspecified, initial encounter: Secondary | ICD-10-CM

## 2017-09-21 NOTE — ED Notes (Signed)
ED Provider at bedside. 

## 2017-09-21 NOTE — ED Triage Notes (Addendum)
Pt reports swelling/blistering to lips after using new lipstick on 12/24-NAD-steady gait. Pt reports she was here last night but was unable to with stand the wait to see a provider. Pt denies sob. Pt speaking in complete sentences.

## 2017-09-21 NOTE — Discharge Instructions (Signed)
Please use Benadryl.  You can use a something non-perfumed and noncolored like Vaseline on her lips until start to feel better.  Please return if you have any spots on your mucosa in your mouth or in your vaginal area.  Please return with any spread of rash.

## 2017-09-21 NOTE — ED Provider Notes (Signed)
[facility] EMERGENCY DEPARTMENT Provider Note   CSN: [id number] Arrival date & time: 09/21/17  1317     History   Chief Complaint Chief Complaint  Patient presents with  . Allergic Reaction    HPI Shirlee MoreFranky J Klemmer is a 51 y.o. female.  HPI  Patient is a 51 year old female presenting with irritation to her lips.  She applied some new lip gloss on Christmas.  Afterwards she developed dryness to them.  So she put on more.  She did this for a day or 2.  Then realizing that was actually reaction to the orgignal gloss that she was putting on she stopped.  Now she is had mild flaking and swelling in that area.  No intraoral lesions.  No spread.  Patient has mild pain.  History reviewed. No pertinent past medical history.  Patient Active Problem List   Diagnosis Date Noted  . Arthritis of carpometacarpal joint 06/19/2013  . Closed fracture of two ribs on left side 03/22/2013  . Hyperlipidemia 02/25/2013  . Major depression 02/22/2013  . Perimenopausal 02/22/2013  . Smoker 02/21/2013  . Preventive measure 02/21/2013  . Degenerative disc disease, cervical 02/21/2013    Past Surgical History:  Procedure Laterality Date  . ECTOPIC PREGNANCY SURGERY      OB History    No data available       Home Medications    Prior to Admission medications   Medication Sig Start Date End Date Taking? Authorizing Provider  ARIPiprazole (ABILIFY) 10 MG tablet Take 1 tablet (10 mg total) by mouth daily. 01/06/15   Monica Bectonhekkekandam, Thomas J, MD  Diclofenac Sodium 2 % SOLN Place 2 sprays onto the skin 2 (two) times daily. 01/22/15   Monica Bectonhekkekandam, Thomas J, MD  HYDROcodone-acetaminophen (NORCO/VICODIN) 5-325 MG tablet Take 1-2 tablets by mouth every 6 (six) hours as needed. 02/12/17   Ward, Layla MawKristen N, DO  ibuprofen (ADVIL,MOTRIN) 800 MG tablet Take 1 tablet (800 mg total) by mouth every 8 (eight) hours as needed for mild pain. 02/12/17   Ward, Layla MawKristen N, DO  oxyCODONE-acetaminophen (ROXICET)  5-325 MG per tablet Take 1 tablet by mouth every 6 (six) hours as needed for severe pain. 01/09/15   Lattie HawBeese, Stephen A, MD  predniSONE (DELTASONE) 50 MG tablet One tab PO daily for 5 days. 01/22/15   Monica Bectonhekkekandam, Thomas J, MD  pregabalin (LYRICA) 50 MG capsule Take 1 capsule (50 mg total) by mouth 2 (two) times daily. 01/22/15   Monica Bectonhekkekandam, Thomas J, MD  traMADol (ULTRAM) 50 MG tablet TAKE ONE TO TWO TABLETS BY MOUTH EVERY 8 HOURS....MAX OF 6 TABLETS BY MOUTH DAILY 04/20/15   Monica Bectonhekkekandam, Thomas J, MD  venlafaxine XR (EFFEXOR XR) 75 MG 24 hr capsule Take 1 capsule (75 mg total) by mouth daily with breakfast. 11/18/14   Monica Bectonhekkekandam, Thomas J, MD    Family History Family History  Problem Relation Age of Onset  . Hypertension Father   . Depression Sister     Social History Social History   Tobacco Use  . Smoking status: Current Every Day Smoker    Packs/day: 1.00    Types: Cigarettes  . Smokeless tobacco: Never Used  Substance Use Topics  . Alcohol use: Yes    Comment: occ  . Drug use: No     Allergies   Penicillins   Review of Systems Review of Systems  Constitutional: Negative for activity change.  Respiratory: Negative for shortness of breath.   Cardiovascular: Negative for chest pain.  Gastrointestinal:  Negative for abdominal pain.     Physical Exam Updated Vital Signs BP 138/88 (BP Location: Right Arm)   Pulse 90   Temp 98.1 F (36.7 C) (Oral)   Resp 20   SpO2 98%   Physical Exam  Constitutional: She is oriented to person, place, and time. She appears well-developed and well-nourished.  HENT:  Head: Normocephalic and atraumatic.  No intraoral lesions.  Mild flaking to lips.  Mild swelling.  Eyes: Right eye exhibits no discharge. Left eye exhibits no discharge.  Cardiovascular: Normal rate.  Pulmonary/Chest: Effort normal.  Neurological: She is oriented to person, place, and time.  Skin: Skin is warm and dry. She is not diaphoretic.  Psychiatric: She has a  normal mood and affect.  Nursing note and vitals reviewed.    ED Treatments / Results  Labs (all labs ordered are listed, but only abnormal results are displayed) Labs Reviewed - No data to display  EKG  EKG Interpretation None       Radiology No results found.  Procedures Procedures (including critical care time)  Medications Ordered in ED Medications - No data to display   Initial Impression / Assessment and Plan / ED Course  I have reviewed the triage vital signs and the nursing notes.  Pertinent labs & imaging results that were available during my care of the patient were reviewed by me and considered in my medical decision making (see chart for details).    Patient is a 51 year old female presenting with irritation to her lips.  She applied some new lip gloss on Christmas.  Afterwards she developed dryness to them.  So she put on more.  She did this for a day or 2.  Then realizing that was actually reaction to the orgignal gloss that she was putting on she stopped.  Now she is had mild flaking and swelling in that area.  No intraoral lesions.  No spread.  Patient has mild pain.  4:06 PM Contact dermatitis in the low cost.  We will have her take Benadryl, use Vaseline as needed.  Strict return precautions stressed.  Final Clinical Impressions(s) / ED Diagnoses   Final diagnoses:  Allergic reaction, initial encounter    ED Discharge Orders    None       Mackuen, Cindee Saltourteney Lyn, MD 09/21/17 1606

## 2018-11-30 ENCOUNTER — Ambulatory Visit (INDEPENDENT_AMBULATORY_CARE_PROVIDER_SITE_OTHER): Payer: Self-pay | Admitting: Sports Medicine

## 2018-11-30 DIAGNOSIS — Z23 Encounter for immunization: Secondary | ICD-10-CM

## 2018-11-30 NOTE — Progress Notes (Signed)
Flu shot

## 2018-12-05 ENCOUNTER — Other Ambulatory Visit: Payer: Self-pay | Admitting: Sports Medicine

## 2018-12-05 ENCOUNTER — Encounter: Payer: Self-pay | Admitting: Sports Medicine

## 2018-12-05 ENCOUNTER — Ambulatory Visit (INDEPENDENT_AMBULATORY_CARE_PROVIDER_SITE_OTHER): Payer: Self-pay

## 2018-12-05 ENCOUNTER — Ambulatory Visit (INDEPENDENT_AMBULATORY_CARE_PROVIDER_SITE_OTHER): Payer: Self-pay | Admitting: Sports Medicine

## 2018-12-05 ENCOUNTER — Other Ambulatory Visit: Payer: Self-pay

## 2018-12-05 DIAGNOSIS — S59002A Unspecified physeal fracture of lower end of ulna, left arm, initial encounter for closed fracture: Secondary | ICD-10-CM

## 2018-12-05 DIAGNOSIS — S52202A Unspecified fracture of shaft of left ulna, initial encounter for closed fracture: Secondary | ICD-10-CM

## 2018-12-05 DIAGNOSIS — S4992XA Unspecified injury of left shoulder and upper arm, initial encounter: Secondary | ICD-10-CM

## 2018-12-05 DIAGNOSIS — W228XXA Striking against or struck by other objects, initial encounter: Secondary | ICD-10-CM

## 2018-12-05 MED ORDER — HYDROCODONE-ACETAMINOPHEN 10-325 MG PO TABS: 1.0000 | ORAL_TABLET | Freq: Three times a day (TID) | ORAL | 0 refills | Status: DC | PRN

## 2018-12-05 NOTE — Assessment & Plan Note (Addendum)
Posterior slab splint. Hydrocodone for pain. Return to see me in 1 to 2 weeks for cast placement. X-ray before visit. Estimate 8 to 12 weeks for healing as she is a smoker.  I billed a fracture code for this encounter, all subsequent visits will be post-op checks in the global period.

## 2018-12-05 NOTE — Progress Notes (Signed)
Subjective:    CC: Left forearm injury  HPI:  This is a pleasant 53 year old female, last night she was holding an iron skillet, while she was moving it across the countertop her arm dropped and she had her ulna on the tip of a [place] countertop.  She had immediate pain, swelling, bruising and a feeling of deformity.  She is here for further evaluation and definitive treatment, pain is severe, localized without radiation.  I reviewed the past medical history, family history, social history, surgical history, and allergies today and no changes were needed.  Please see the problem list section below in epic for further details.  Past Medical History: No past medical history on file. Past Surgical History: Past Surgical History:  Procedure Laterality Date  . ECTOPIC PREGNANCY SURGERY     Social History: Social History   Socioeconomic History  . Marital status: Married    Spouse name: Not on file  . Number of children: Not on file  . Years of education: Not on file  . Highest education level: Not on file  Occupational History  . Not on file  Social Needs  . Financial resource strain: Not on file  . Food insecurity:    Worry: Not on file    Inability: Not on file  . Transportation needs:    Medical: Not on file    Non-medical: Not on file  Tobacco Use  . Smoking status: Current Every Day Smoker    Packs/day: 1.00    Types: Cigarettes  . Smokeless tobacco: Never Used  Substance and Sexual Activity  . Alcohol use: Yes    Comment: occ  . Drug use: No  . Sexual activity: Not on file  Lifestyle  . Physical activity:    Days per week: Not on file    Minutes per session: Not on file  . Stress: Not on file  Relationships  . Social connections:    Talks on phone: Not on file    Gets together: Not on file    Attends religious service: Not on file    Active member of club or organization: Not on file    Attends meetings of clubs or organizations: Not on file   Relationship status: Not on file  Other Topics Concern  . Not on file  Social History Narrative  . Not on file   Family History: Family History  Problem Relation Age of Onset  . Hypertension Father   . Depression Sister    Allergies: Allergies  Allergen Reactions  . Penicillins    Medications: See med rec.  Review of Systems: No headache, visual changes, nausea, vomiting, diarrhea, constipation, dizziness, abdominal pain, skin rash, fevers, chills, night sweats, swollen lymph nodes, weight loss, chest pain, body aches, joint swelling, muscle aches, shortness of breath, mood changes, visual or auditory hallucinations.  Objective:    General: Well Developed, well nourished, and in no acute distress.  Neuro: Alert and oriented x3, extra-ocular muscles intact, sensation grossly intact.  HEENT: Normocephalic, atraumatic, pupils equal round reactive to light, neck supple, no masses, no lymphadenopathy, thyroid nonpalpable.  Skin: Warm and dry, no rashes noted.  Cardiac: Regular rate and rhythm, no murmurs rubs or gallops.  Respiratory: Clear to auscultation bilaterally. Not using accessory muscles, speaking in full sentences.  Abdominal: Soft, nontender, nondistended, positive bowel sounds, no masses, no organomegaly.  Left forearm: Tender to palpation over the ulnar shaft, palpable step-off.  Neurovascularly intact distally.  X-rays personally reviewed, there is a 50%  displaced, nonangulated fracture of the ulnar shaft.  Posterior slab splint placed.  Impression and Recommendations:    The patient was counselled, risk factors were discussed, anticipatory guidance given.  Traumatic closed displaced fracture of shaft of ulna, left, initial encounter Posterior slab splint. Hydrocodone for pain. Return to see me in 1 to 2 weeks for cast placement. X-ray before visit. Estimate 8 to 12 weeks for healing as she is a smoker.  I billed a fracture code for this encounter, all  subsequent visits will be post-op checks in the global period.   ___________________________________________ Ihor Austin. Benjamin Stain, M.D., ABFM., CAQSM. Primary Care and Sports Medicine [facility]  Adjunct Professor of Family Medicine  [employer]

## 2018-12-12 ENCOUNTER — Other Ambulatory Visit: Payer: Self-pay

## 2018-12-12 ENCOUNTER — Ambulatory Visit (INDEPENDENT_AMBULATORY_CARE_PROVIDER_SITE_OTHER): Payer: Self-pay | Admitting: Sports Medicine

## 2018-12-12 ENCOUNTER — Encounter: Payer: Self-pay | Admitting: Sports Medicine

## 2018-12-12 ENCOUNTER — Ambulatory Visit (INDEPENDENT_AMBULATORY_CARE_PROVIDER_SITE_OTHER): Payer: Self-pay

## 2018-12-12 DIAGNOSIS — S52202A Unspecified fracture of shaft of left ulna, initial encounter for closed fracture: Secondary | ICD-10-CM

## 2018-12-12 DIAGNOSIS — S52692D Other fracture of lower end of left ulna, subsequent encounter for closed fracture with routine healing: Secondary | ICD-10-CM

## 2018-12-12 DIAGNOSIS — W228XXD Striking against or struck by other objects, subsequent encounter: Secondary | ICD-10-CM

## 2018-12-12 MED ORDER — HYDROCODONE-ACETAMINOPHEN 10-325 MG PO TABS: 1.0000 | ORAL_TABLET | Freq: Three times a day (TID) | ORAL | 0 refills | Status: DC | PRN

## 2018-12-12 NOTE — Assessment & Plan Note (Signed)
Fracture remains 50% displaced, nonangulated. Long-arm cast applied. Refilling pain medication, she uses it approximately 3 times per day, we are going to give her 2 weeks. Return to see me in 2 weeks, x-ray before visit. I do anticipate at least 6 more weeks in the long-arm cast, though I may transition her to a short arm cast for 4 weeks.

## 2018-12-12 NOTE — Progress Notes (Signed)
  Subjective: This is a pleasant 53 year old female, she is approximately 1 week post fracture of the ulnar shaft.  She has been in a sugar tong splint.  Doing much better.  Objective: General: Well-developed, well-nourished, and in no acute distress. Left forearm: Sugar tong splint is removed, she is still pretty tender over the fracture, forearm swelling has improved, she is fairly swollen over her hand.  Neurovascularly intact distally.  X-rays show stability of the fracture, it continues to be for the most part nonangulated and about 50% displaced.  Long-arm cast placed.  Assessment/plan:   Traumatic closed displaced fracture of shaft of ulna, left, initial encounter Fracture remains 50% displaced, nonangulated. Long-arm cast applied. Refilling pain medication, she uses it approximately 3 times per day, we are going to give her 2 weeks. Return to see me in 2 weeks, x-ray before visit. I do anticipate at least 6 more weeks in the long-arm cast, though I may transition her to a short arm cast for 4 weeks.    ___________________________________________ Ihor Austin. Benjamin Stain, M.D., ABFM., CAQSM. Primary Care and Sports Medicine [place] [facility]  Adjunct Professor of Family Medicine  [employer]

## 2018-12-19 ENCOUNTER — Ambulatory Visit: Payer: Self-pay | Admitting: Sports Medicine

## 2018-12-27 ENCOUNTER — Ambulatory Visit (INDEPENDENT_AMBULATORY_CARE_PROVIDER_SITE_OTHER): Payer: Self-pay

## 2018-12-27 ENCOUNTER — Ambulatory Visit (INDEPENDENT_AMBULATORY_CARE_PROVIDER_SITE_OTHER): Payer: Self-pay | Admitting: Sports Medicine

## 2018-12-27 ENCOUNTER — Other Ambulatory Visit: Payer: Self-pay

## 2018-12-27 DIAGNOSIS — S52202A Unspecified fracture of shaft of left ulna, initial encounter for closed fracture: Secondary | ICD-10-CM

## 2018-12-27 DIAGNOSIS — S52202D Unspecified fracture of shaft of left ulna, subsequent encounter for closed fracture with routine healing: Secondary | ICD-10-CM

## 2018-12-27 DIAGNOSIS — W228XXD Striking against or struck by other objects, subsequent encounter: Secondary | ICD-10-CM

## 2018-12-27 MED ORDER — HYDROCODONE-ACETAMINOPHEN 10-325 MG PO TABS: 1.0000 | ORAL_TABLET | Freq: Three times a day (TID) | ORAL | 0 refills | Status: DC | PRN

## 2018-12-27 NOTE — Assessment & Plan Note (Signed)
Now approximately 2 weeks post ulnar shaft fracture. X-rays show stability. Continue short arm cast for another 4 weeks, she does smoke so this will delay bone healing. Refilling pain medication which she needs about 3 times a day right now. X-ray before the next visit in a month. I will probably transition her into a short arm cast at that time.

## 2018-12-27 NOTE — Progress Notes (Signed)
  Subjective: 2 weeks post fracture, doing well  Objective: General: Well-developed, well-nourished, and in no acute distress. Left arm: Cast is in good shape, neurovascularly intact distally.  X-ray shows stability, shaft fracture of the ulna continues to be about 50% displaced and nonangulated  Assessment/plan:   Traumatic closed displaced fracture of shaft of ulna, left, initial encounter Now approximately 2 weeks post ulnar shaft fracture. X-rays show stability. Continue short arm cast for another 4 weeks, she does smoke so this will delay bone healing. Refilling pain medication which she needs about 3 times a day right now. X-ray before the next visit in a month. I will probably transition her into a short arm cast at that time.    ___________________________________________ Ihor Austin. Benjamin Stain, M.D., ABFM., CAQSM. Primary Care and Sports Medicine [place] [facility]  Adjunct Professor of Family Medicine  [employer]

## 2018-12-31 ENCOUNTER — Ambulatory Visit: Payer: Self-pay | Admitting: Sports Medicine

## 2019-01-18 ENCOUNTER — Emergency Department (HOSPITAL_COMMUNITY): Payer: Medicaid Other

## 2019-01-18 ENCOUNTER — Other Ambulatory Visit: Payer: Self-pay

## 2019-01-18 ENCOUNTER — Emergency Department (HOSPITAL_COMMUNITY)
Admission: EM | Admit: 2019-01-18 | Discharge: 2019-01-18 | Disposition: A | Discharge status: Home / Self Care | Payer: Medicaid Other | Attending: Emergency Medicine | Admitting: Emergency Medicine

## 2019-01-18 ENCOUNTER — Encounter (HOSPITAL_COMMUNITY): Payer: Self-pay | Admitting: Emergency Medicine

## 2019-01-18 DIAGNOSIS — F17210 Nicotine dependence, cigarettes, uncomplicated: Secondary | ICD-10-CM | POA: Insufficient documentation

## 2019-01-18 DIAGNOSIS — Z4789 Encounter for other orthopedic aftercare: Secondary | ICD-10-CM | POA: Insufficient documentation

## 2019-01-18 NOTE — ED Provider Notes (Signed)
[facility] Provider Note   CSN: [id number] Arrival date & time: 01/18/19  1300    History   Chief Complaint Chief Complaint  Patient presents with  . Cast Problem    HPI Teresa Mayer is a 53 y.o. female.     Patient is a 53 year old female who suffered a traumatic left ulnar shaft fracture is beginning of this month.  She had follow-up with sports medicine and was in a cast.  Sports medicine saw her on April 2 advised she stay in the cast for another 4 weeks.  Patient reports that the cast is very bothersome and she would like to have it taken off now.  Reports that she is moving it and will not have transportation to follow-up with her original provider.     History reviewed. No pertinent past medical history.  Patient Active Problem List   Diagnosis Date Noted  . Traumatic closed displaced fracture of shaft of ulna, left, initial encounter 12/05/2018  . Arthritis of carpometacarpal joint 06/19/2013  . Closed fracture of two ribs on left side 03/22/2013  . Hyperlipidemia 02/25/2013  . Major depression 02/22/2013  . Perimenopausal 02/22/2013  . Smoker 02/21/2013  . Preventive measure 02/21/2013  . Degenerative disc disease, cervical 02/21/2013    Past Surgical History:  Procedure Laterality Date  . ECTOPIC PREGNANCY SURGERY       OB History   No obstetric history on file.      Home Medications    Prior to Admission medications   Medication Sig Start Date End Date Taking? Authorizing Provider  HYDROcodone-acetaminophen (NORCO) 10-325 MG tablet Take 1 tablet by mouth every 8 (eight) hours as needed. 12/27/18   Monica Bectonhekkekandam, Thomas J, MD    Family History Family History  Problem Relation Age of Onset  . Hypertension Father   . Depression Sister     Social History Social History   Tobacco Use  . Smoking status: Current Every Day Smoker    Packs/day: 1.00    Types: Cigarettes  . Smokeless tobacco: Never Used   Substance Use Topics  . Alcohol use: Yes    Comment: occ  . Drug use: No     Allergies   Penicillins   Review of Systems Review of Systems  Constitutional: Negative for chills and fever.  HENT: Negative for ear pain and sore throat.   Eyes: Negative for pain and visual disturbance.  Respiratory: Negative for cough and shortness of breath.   Cardiovascular: Negative for chest pain and palpitations.  Gastrointestinal: Negative for abdominal pain and vomiting.  Genitourinary: Negative for dysuria and hematuria.  Musculoskeletal: Positive for arthralgias. Negative for back pain.  Skin: Negative for color change, pallor, rash and wound.  Neurological: Negative for seizures and syncope.  All other systems reviewed and are negative.    Physical Exam Updated Vital Signs BP (!) 126/94 (BP Location: Right Arm)   Pulse 93   Temp 98.5 F (36.9 C) (Oral)   Resp 18   Ht 5\' 6"  (1.676 m)   Wt 59 kg   SpO2 97%   BMI 20.98 kg/m   Physical Exam Vitals signs and nursing note reviewed.  Constitutional:      Appearance: Normal appearance.  HENT:     Head: Normocephalic.  Eyes:     Conjunctiva/sclera: Conjunctivae normal.  Pulmonary:     Effort: Pulmonary effort is normal.  Skin:    General: Skin is dry.     Capillary Refill:  Capillary refill takes less than 2 seconds.     Comments: Normal range of motion of her fingers.  She is in a long-arm left-sided cast.  No signs of compartment syndrome.  Normal distal pulses and capillary refill.  Normal skin color.  Neurological:     General: No focal deficit present.     Mental Status: She is alert.  Psychiatric:        Mood and Affect: Mood normal.      ED Treatments / Results  Labs (all labs ordered are listed, but only abnormal results are displayed) Labs Reviewed - No data to display  EKG None  Radiology No results found.  Procedures Procedures (including critical care time)  Medications Ordered in ED Medications -  No data to display   Initial Impression / Assessment and Plan / ED Course  I have reviewed the triage vital signs and the nursing notes.  Pertinent labs & imaging results that were available during my care of the patient were reviewed by me and considered in my medical decision making (see chart for details).  Clinical Course as of Jan 17 1402  Fri Jan 18, 2019  1401 Discussed case with Earney Hamburg, PA on call.  He advised to keep her in a long-arm splint.  Since the splint is uncomfortable we will have it changed.  She will follow-up with orthopedics in 1 week   [KM]    Clinical Course User Index [KM] Arlyn Dunning, PA-C       Based on review of vitals, medical screening exam, lab work and/or imaging, there does not appear to be an acute, emergent etiology for the patient's symptoms. Counseled pt on good return precautions and encouraged both PCP and ED follow-up as needed.  Prior to discharge, I also discussed incidental imaging findings with patient in detail and advised appropriate, recommended follow-up in detail.  Clinical Impression: 1. Cast discomfort     Disposition: Discharge  Prior to providing a prescription for a controlled substance, I independently reviewed the patient's recent prescription history on the [place] Controlled Substance Reporting System. The patient had no recent or regular prescriptions and was deemed appropriate for a brief, less than 3 day prescription of narcotic for acute analgesia.  This note was prepared with assistance of Conservation officer, historic buildings. Occasional wrong-word or sound-a-like substitutions may have occurred due to the inherent limitations of voice recognition software.   Final Clinical Impressions(s) / ED Diagnoses   Final diagnoses:  Cast discomfort    ED Discharge Orders    None       Jeral Pinch 01/18/19 1403    Charlynne Pander, MD 01/18/19 [phone]

## 2019-01-18 NOTE — Discharge Instructions (Addendum)
Thank you for allowing me to care for you today. Please return to the emergency department if you have new or worsening symptoms. Take your medications as instructed.  ° °

## 2019-01-18 NOTE — ED Notes (Signed)
Ortho at bedside.

## 2019-01-18 NOTE — Progress Notes (Signed)
Orthopedic Tech Progress Note Patient Details:  Teresa Mayer [birth year] [id number]  Casting Type of Cast: Long arm cast Cast Location: lue. courtland assisted with cast removal. I applied new cast. Cast Material: Fiberglass Cast Intervention: Removal, Re-application  Post Interventions Patient Tolerated: Well Instructions Provided: Care of device     Trinna Post 01/18/2019, 3:18 PM

## 2019-01-18 NOTE — ED Notes (Signed)
Spoke with Ortho who will replace cast.

## 2019-01-18 NOTE — ED Notes (Signed)
Ortho completed removal and applied new cast.

## 2019-01-18 NOTE — ED Triage Notes (Signed)
Patient has a cast on left arm and started to remove cast on left arm near hand because it was starting to rub.  Able to move all fingers equally, cap refill less then 3 seconds, radial pulses +2.

## 2019-01-25 ENCOUNTER — Ambulatory Visit: Payer: Self-pay | Admitting: Sports Medicine

## 2019-01-28 ENCOUNTER — Ambulatory Visit (INDEPENDENT_AMBULATORY_CARE_PROVIDER_SITE_OTHER): Payer: Medicaid Other | Admitting: Sports Medicine

## 2019-01-28 ENCOUNTER — Ambulatory Visit (INDEPENDENT_AMBULATORY_CARE_PROVIDER_SITE_OTHER): Payer: Self-pay

## 2019-01-28 ENCOUNTER — Other Ambulatory Visit: Payer: Self-pay

## 2019-01-28 DIAGNOSIS — F1910 Other psychoactive substance abuse, uncomplicated: Secondary | ICD-10-CM

## 2019-01-28 DIAGNOSIS — S52202A Unspecified fracture of shaft of left ulna, initial encounter for closed fracture: Secondary | ICD-10-CM

## 2019-01-28 MED ORDER — SENNA-DOCUSATE SODIUM 8.6-50 MG PO TABS
1.00 | ORAL_TABLET | ORAL | Status: DC
Start: ? — End: 2019-01-28

## 2019-01-28 MED ORDER — CLONIDINE HCL 0.1 MG PO TABS
0.10 | ORAL_TABLET | ORAL | Status: DC
Start: ? — End: 2019-01-28

## 2019-01-28 MED ORDER — LORAZEPAM 2 MG/ML IJ SOLN
2.00 | INTRAMUSCULAR | Status: DC
Start: ? — End: 2019-01-28

## 2019-01-28 MED ORDER — ALUM & MAG HYDROXIDE-SIMETH 200-200-20 MG/5ML PO SUSP
30.00 | ORAL | Status: DC
Start: ? — End: 2019-01-28

## 2019-01-28 MED ORDER — CEPHALEXIN 500 MG PO CAPS
500.00 | ORAL_CAPSULE | ORAL | Status: DC
Start: 2019-01-27 — End: 2019-01-28

## 2019-01-28 MED ORDER — ACETAMINOPHEN 325 MG PO TABS
650.00 | ORAL_TABLET | ORAL | Status: DC
Start: ? — End: 2019-01-28

## 2019-01-28 MED ORDER — GENERIC EXTERNAL MEDICATION
Status: DC
Start: ? — End: 2019-01-28

## 2019-01-28 MED ORDER — IBUPROFEN 600 MG PO TABS
600.00 | ORAL_TABLET | ORAL | Status: DC
Start: ? — End: 2019-01-28

## 2019-01-28 MED ORDER — THERA PO TABS
1.00 | ORAL_TABLET | ORAL | Status: DC
Start: 2019-01-27 — End: 2019-01-28

## 2019-01-28 MED ORDER — FOLIC ACID 1 MG PO TABS
1.00 | ORAL_TABLET | ORAL | Status: DC
Start: 2019-01-28 — End: 2019-01-28

## 2019-01-28 MED ORDER — HYDROXYZINE PAMOATE 25 MG PO CAPS
50.00 | ORAL_CAPSULE | ORAL | Status: DC
Start: ? — End: 2019-01-28

## 2019-01-28 MED ORDER — TRAZODONE HCL 50 MG PO TABS
50.00 | ORAL_TABLET | ORAL | Status: DC
Start: ? — End: 2019-01-28

## 2019-01-28 MED ORDER — THIAMINE HCL 100 MG PO TABS
100.00 | ORAL_TABLET | ORAL | Status: DC
Start: 2019-01-28 — End: 2019-01-28

## 2019-01-28 NOTE — Assessment & Plan Note (Signed)
Alcohol intoxication, amphetamine use with psychosis. She has already set up an appointment with [facility] per her statement. No suicidal or homicidal ideation. We do need to avoid controlled substances in this patient.

## 2019-01-28 NOTE — Progress Notes (Signed)
  Subjective: Approximately 7 weeks post fracture, doing well.  Objective: General: Well-developed, well-nourished, and in no acute distress. Left arm: Cast is removed, only minimal tenderness over the fracture, we applied a short arm fracture brace.  Good bony callus on x-rays, 50% displaced and stable.  Assessment/plan:   Traumatic closed displaced fracture of shaft of ulna, left, initial encounter Approximately 7 weeks post fracture, doing well, she has a bit of tenderness over the fracture site. Good bony callus on x-rays. Exos fracture brace applied, return to see me in 4 weeks, x-ray before visit.  Substance abuse (HCC) Alcohol intoxication, amphetamine use with psychosis. She has already set up an appointment [facility] per her statement. No suicidal or homicidal ideation. We do need to avoid controlled substances in this patient.    ___________________________________________ Ihor Austin. Benjamin Stain, M.D., ABFM., CAQSM. Primary Care and Sports Medicine [facility]  Adjunct Professor of Family Medicine  [employer]

## 2019-01-28 NOTE — Assessment & Plan Note (Signed)
Approximately 7 weeks post fracture, doing well, she has a bit of tenderness over the fracture site. Good bony callus on x-rays. Exos fracture brace applied, return to see me in 4 weeks, x-ray before visit.

## 2019-01-31 ENCOUNTER — Telehealth (INDEPENDENT_AMBULATORY_CARE_PROVIDER_SITE_OTHER): Payer: Self-pay | Admitting: Sports Medicine

## 2019-01-31 DIAGNOSIS — F29 Unspecified psychosis not due to a substance or known physiological condition: Secondary | ICD-10-CM

## 2019-01-31 NOTE — Assessment & Plan Note (Signed)
Bizarre and magical thinking, manifesting multiple different personalities, paranoia, needs urgent evaluation by psychiatry.

## 2019-01-31 NOTE — Telephone Encounter (Signed)
Jan 29, 2019  Schimmelpfenning, Darwin  to Me        4:29 PM  I think that is where to start. She never had a illicit drug problem ever, the alcohol has been slowly getting worse in last 2-3 yrs. It's like a dual personality. Today she still thinks like [place], but "rainbow" is obsessed with these crystals in the sun. We had to go 1 mile away because she stashes her purse in the woods around here overnight thinking someone's going to take something. I've been with her all day, no drug use.   Me       1:37 PM  Note    Bizarre/magical thinking, psychotic activity, manifesting multiple different personalities. Possible methamphetamine use. I would like her evaluated by psychiatry.    Me  to Schimmelpfenning, Darwin        1:36 PM  This needs evaluation by a psychiatrist. If she has not been assigned one I am going to try to get her in with our psychiatrist downstairs.  ___________________________________________  Ihor Austin. Benjamin Stain, M.D., ABFM., CAQSM.  Primary Care and Sports Medicine  [facility]   Adjunct Professor of Family Medicine  [employer]    Last read by Franklyn Lor at 4:20 PM on 01/29/2019.        1:06 PM  Chalmers Cater, CMA routed this conversation to Me  Schimmelpfenning, Darwin  to Me        12:44 PM  No, when we left from her visit with you, at [facility] approx. 1 hr she had already had gone into some split personality & she got out of car at home walked down road. Approx. 45 min later I found her in [employer] parking lot acting crazy. Someone had already reported it to police, they pulled up same time I did. She showed no signs of alcohol or meth use, other than acting foolish. She slept well last night. I woke up to "rainbow" not Frankly this morning & she never left our room. I know there's nothing in this room, I checked thoroughly.   Me  to Schimmelpfenning, Darwin        10:13  AM  Did she recently use any methamphetamine?  ___________________________________________  Ihor Austin. Benjamin Stain, M.D., ABFM., CAQSM.  Primary Care and Sports Medicine   [facility]   Adjunct Professor of Family Medicine  [employer]    Last read by Franklyn Lor at 12:31 PM on 01/29/2019.        7:04 AM  Chalmers Cater, CMA routed this conversation to Me  Jan 28, 2019  Schimmelpfenning, Darwin  to Me        5:41 PM  She did it again. When we left your office went to [facility] & she bought this necklace, got to [facility] & she was doing it again trying to charge in the sun. Got home & she took off & I can't find her. She needs help bad & she refuses it. She did not call [facility]

## 2019-02-25 ENCOUNTER — Encounter: Payer: Self-pay | Admitting: Sports Medicine

## 2019-02-25 ENCOUNTER — Other Ambulatory Visit: Payer: Self-pay

## 2019-02-25 ENCOUNTER — Ambulatory Visit (INDEPENDENT_AMBULATORY_CARE_PROVIDER_SITE_OTHER): Payer: Medicaid Other | Admitting: Sports Medicine

## 2019-02-25 ENCOUNTER — Ambulatory Visit (INDEPENDENT_AMBULATORY_CARE_PROVIDER_SITE_OTHER): Payer: Self-pay

## 2019-02-25 DIAGNOSIS — Z299 Encounter for prophylactic measures, unspecified: Secondary | ICD-10-CM

## 2019-02-25 DIAGNOSIS — S52202A Unspecified fracture of shaft of left ulna, initial encounter for closed fracture: Secondary | ICD-10-CM

## 2019-02-25 DIAGNOSIS — S52202G Unspecified fracture of shaft of left ulna, subsequent encounter for closed fracture with delayed healing: Secondary | ICD-10-CM

## 2019-02-25 DIAGNOSIS — F329 Major depressive disorder, single episode, unspecified: Secondary | ICD-10-CM

## 2019-02-25 NOTE — Assessment & Plan Note (Signed)
Due for cervical cancer screening, breast cancer screening, colon cancer screening.

## 2019-02-25 NOTE — Assessment & Plan Note (Signed)
Healing well, good bony callus, no longer tender over the fracture.

## 2019-02-25 NOTE — Assessment & Plan Note (Signed)
Referral to psychiatry as well as behavioral therapy.

## 2019-02-25 NOTE — Progress Notes (Signed)
Subjective:    CC: Follow-up  HPI: Left ulnar shaft fracture: Doing much better, 11 weeks post fracture, good bony callus.  No longer tender over the fracture site.  Mood disorder: No suicidal or homicidal ideation, did have a brief history of polysubstance abuse, does need a referral to psychiatry, as well as behavioral therapy.  Preventive measures: Due for mammogram, Pap smear, colon cancer screening.  I reviewed the past medical history, family history, social history, surgical history, and allergies today and no changes were needed.  Please see the problem list section below in epic for further details.  Past Medical History: No past medical history on file. Past Surgical History: Past Surgical History:  Procedure Laterality Date  . ECTOPIC PREGNANCY SURGERY     Social History: Social History   Socioeconomic History  . Marital status: Married    Spouse name: Not on file  . Number of children: Not on file  . Years of education: Not on file  . Highest education level: Not on file  Occupational History  . Not on file  Social Needs  . Financial resource strain: Not on file  . Food insecurity:    Worry: Not on file    Inability: Not on file  . Transportation needs:    Medical: Not on file    Non-medical: Not on file  Tobacco Use  . Smoking status: Current Every Day Smoker    Packs/day: 1.00    Types: Cigarettes  . Smokeless tobacco: Never Used  Substance and Sexual Activity  . Alcohol use: Yes    Comment: occ  . Drug use: No  . Sexual activity: Not on file  Lifestyle  . Physical activity:    Days per week: Not on file    Minutes per session: Not on file  . Stress: Not on file  Relationships  . Social connections:    Talks on phone: Not on file    Gets together: Not on file    Attends religious service: Not on file    Active member of club or organization: Not on file    Attends meetings of clubs or organizations: Not on file    Relationship status: Not  on file  Other Topics Concern  . Not on file  Social History Narrative  . Not on file   Family History: Family History  Problem Relation Age of Onset  . Hypertension Father   . Depression Sister    Allergies: Allergies  Allergen Reactions  . Penicillins    Medications: See med rec.  Review of Systems: No fevers, chills, night sweats, weight loss, chest pain, or shortness of breath.   Objective:    General: Well Developed, well nourished, and in no acute distress.  Neuro: Alert and oriented x3, extra-ocular muscles intact, sensation grossly intact.  HEENT: Normocephalic, atraumatic, pupils equal round reactive to light, neck supple, no masses, no lymphadenopathy, thyroid nonpalpable.  Skin: Warm and dry, no rashes. Cardiac: Regular rate and rhythm, no murmurs rubs or gallops, no lower extremity edema.  Respiratory: Clear to auscultation bilaterally. Not using accessory muscles, speaking in full sentences. Left wrist: Visibly full bony callus over the ulnar shaft, no longer tender. ROM smooth and normal with good flexion and extension and ulnar/radial deviation that is symmetrical with opposite wrist. Palpation is normal over metacarpals, navicular, lunate, and TFCC; tendons without tenderness/ swelling No snuffbox tenderness. No tenderness over Canal of Gu[place]. Strength 5/5 in all directions without pain. Negative tinel's and phalens signs.  Negative Finkelstein sign. Negative Watson's test.  Impression and Recommendations:    Traumatic closed displaced fracture of shaft of ulna, left, initial encounter Healing well, good bony callus, no longer tender over the fracture.  Preventive measure Due for cervical cancer screening, breast cancer screening, colon cancer screening.  Major depression Referral to psychiatry as well as behavioral therapy.   ___________________________________________ Ihor Austin. Benjamin Stain, M.D., ABFM., CAQSM. Primary Care and Sports Medicine  [facility]  Adjunct Professor of Family Medicine  [employer]

## 2019-05-28 ENCOUNTER — Ambulatory Visit: Payer: Medicaid Other | Admitting: Sports Medicine

## 2019-07-16 ENCOUNTER — Ambulatory Visit (INDEPENDENT_AMBULATORY_CARE_PROVIDER_SITE_OTHER): Payer: Self-pay | Admitting: Sports Medicine

## 2019-07-16 DIAGNOSIS — Z23 Encounter for immunization: Secondary | ICD-10-CM

## 2019-07-23 ENCOUNTER — Other Ambulatory Visit: Payer: Self-pay

## 2019-07-23 ENCOUNTER — Ambulatory Visit (INDEPENDENT_AMBULATORY_CARE_PROVIDER_SITE_OTHER): Payer: Self-pay | Admitting: Sports Medicine

## 2019-07-23 ENCOUNTER — Encounter: Payer: Self-pay | Admitting: Sports Medicine

## 2019-07-23 DIAGNOSIS — F418 Other specified anxiety disorders: Secondary | ICD-10-CM

## 2019-07-23 DIAGNOSIS — Z299 Encounter for prophylactic measures, unspecified: Secondary | ICD-10-CM

## 2019-07-23 MED ORDER — ESCITALOPRAM OXALATE 5 MG PO TABS: 5.0000 mg | ORAL_TABLET | Freq: Every day | ORAL | 3 refills | Status: DC

## 2019-07-23 MED ORDER — HYDROXYZINE HCL 50 MG PO TABS: 50.0000 mg | ORAL_TABLET | Freq: Three times a day (TID) | ORAL | 3 refills | Status: DC | PRN

## 2019-07-23 NOTE — Assessment & Plan Note (Signed)
Due for cervical cancer screening, referral to gynecology. Due for colon cancer screening, ordering Cologuard. Ordering mammogram.

## 2019-07-23 NOTE — Progress Notes (Signed)
Subjective:    CC: Multiple issues  HPI: Preventive measure: Due for cervical cancer, colon cancer, breast cancer screening.  Anxiety and depression: Husband has had triple bypass, sternal dehiscence, drain placement, chronic infection, she has significant anxiety and depression symptoms without suicidal or homicidal ideation, agreeable to aggressive treatment.  I reviewed the past medical history, family history, social history, surgical history, and allergies today and no changes were needed.  Please see the problem list section below in epic for further details.  Past Medical History: No past medical history on file. Past Surgical History: Past Surgical History:  Procedure Laterality Date  . ECTOPIC PREGNANCY SURGERY     Social History: Social History   Socioeconomic History  . Marital status: Married    Spouse name: Not on file  . Number of children: Not on file  . Years of education: Not on file  . Highest education level: Not on file  Occupational History  . Not on file  Social Needs  . Financial resource strain: Not on file  . Food insecurity    Worry: Not on file    Inability: Not on file  . Transportation needs    Medical: Not on file    Non-medical: Not on file  Tobacco Use  . Smoking status: Current Every Day Smoker    Packs/day: 1.00    Types: Cigarettes  . Smokeless tobacco: Never Used  Substance and Sexual Activity  . Alcohol use: Yes    Comment: occ  . Drug use: No  . Sexual activity: Not on file  Lifestyle  . Physical activity    Days per week: Not on file    Minutes per session: Not on file  . Stress: Not on file  Relationships  . Social Herbalist on phone: Not on file    Gets together: Not on file    Attends religious service: Not on file    Active member of club or organization: Not on file    Attends meetings of clubs or organizations: Not on file    Relationship status: Not on file  Other Topics Concern  . Not on file   Social History Narrative  . Not on file   Family History: Family History  Problem Relation Age of Onset  . Hypertension Father   . Depression Sister    Allergies: Allergies  Allergen Reactions  . Penicillins    Medications: See med rec.  Review of Systems: No fevers, chills, night sweats, weight loss, chest pain, or shortness of breath.   Objective:    General: Well Developed, well nourished, and in no acute distress.  Neuro: Alert and oriented x3, extra-ocular muscles intact, sensation grossly intact.  HEENT: Normocephalic, atraumatic, pupils equal round reactive to light, neck supple, no masses, no lymphadenopathy, thyroid nonpalpable.  Skin: Warm and dry, no rashes. Cardiac: Regular rate and rhythm, no murmurs rubs or gallops, no lower extremity edema.  Respiratory: Clear to auscultation bilaterally. Not using accessory muscles, speaking in full sentences.  Impression and Recommendations:    Depression with anxiety Discussed the treatment plan, adding behavioral therapy, Lexapro 5, hydroxyzine for as needed use. Return to see [place] in a month.  Preventive measure Due for cervical cancer screening, referral to gynecology. Due for colon cancer screening, ordering Cologuard. Ordering mammogram.   ___________________________________________ Gwen Her. Dianah Field, M.D., ABFM., CAQSM. Primary Care and Sports Medicine [facility]  Adjunct Professor of [place] [employer] [facility] of  Medicine

## 2019-07-23 NOTE — Assessment & Plan Note (Signed)
Discussed the treatment plan, adding behavioral therapy, Lexapro 5, hydroxyzine for as needed use. Return to see [place] in a month.

## 2019-08-15 ENCOUNTER — Other Ambulatory Visit: Payer: Self-pay

## 2019-08-15 ENCOUNTER — Ambulatory Visit (INDEPENDENT_AMBULATORY_CARE_PROVIDER_SITE_OTHER): Payer: Self-pay

## 2019-08-15 DIAGNOSIS — Z299 Encounter for prophylactic measures, unspecified: Secondary | ICD-10-CM

## 2019-08-20 ENCOUNTER — Encounter: Payer: Self-pay | Admitting: Sports Medicine

## 2019-08-20 ENCOUNTER — Other Ambulatory Visit: Payer: Self-pay | Admitting: Sports Medicine

## 2019-08-20 ENCOUNTER — Ambulatory Visit (INDEPENDENT_AMBULATORY_CARE_PROVIDER_SITE_OTHER): Payer: Self-pay | Admitting: Sports Medicine

## 2019-08-20 DIAGNOSIS — N6310 Unspecified lump in the right breast, unspecified quadrant: Secondary | ICD-10-CM

## 2019-08-20 DIAGNOSIS — F418 Other specified anxiety disorders: Secondary | ICD-10-CM

## 2019-08-20 DIAGNOSIS — R928 Other abnormal and inconclusive findings on diagnostic imaging of breast: Secondary | ICD-10-CM

## 2019-08-20 MED ORDER — HYDROXYZINE HCL 50 MG PO TABS: 50.0000 mg | ORAL_TABLET | Freq: Three times a day (TID) | ORAL | 3 refills | Status: AC | PRN

## 2019-08-20 MED ORDER — ESCITALOPRAM OXALATE 5 MG PO TABS: 5.0000 mg | ORAL_TABLET | Freq: Every day | ORAL | 3 refills | Status: AC

## 2019-08-20 NOTE — Progress Notes (Signed)
  Subjective:    CC: Follow-up  HPI: Anxiety and depression: Good improvement on Lexapro 5.  Breast mass: Recently noted, on screening mammogram, she has additional imaging scheduled.  I reviewed the past medical history, family history, social history, surgical history, and allergies today and no changes were needed.  Please see the problem list section below in epic for further details.  Past Medical History: No past medical history on file. Past Surgical History: Past Surgical History:  Procedure Laterality Date  . ECTOPIC PREGNANCY SURGERY     Social History: Social History   Socioeconomic History  . Marital status: Married    Spouse name: Not on file  . Number of children: Not on file  . Years of education: Not on file  . Highest education level: Not on file  Occupational History  . Not on file  Social Needs  . Financial resource strain: Not on file  . Food insecurity    Worry: Not on file    Inability: Not on file  . Transportation needs    Medical: Not on file    Non-medical: Not on file  Tobacco Use  . Smoking status: Current Every Day Smoker    Packs/day: 1.00    Types: Cigarettes  . Smokeless tobacco: Never Used  Substance and Sexual Activity  . Alcohol use: Yes    Comment: occ  . Drug use: No  . Sexual activity: Not on file  Lifestyle  . Physical activity    Days per week: Not on file    Minutes per session: Not on file  . Stress: Not on file  Relationships  . Social Herbalist on phone: Not on file    Gets together: Not on file    Attends religious service: Not on file    Active member of club or organization: Not on file    Attends meetings of clubs or organizations: Not on file    Relationship status: Not on file  Other Topics Concern  . Not on file  Social History Narrative  . Not on file   Family History: Family History  Problem Relation Age of Onset  . Hypertension Father   . Depression Sister    Allergies: Allergies   Allergen Reactions  . Penicillins    Medications: See med rec.  Review of Systems: No fevers, chills, night sweats, weight loss, chest pain, or shortness of breath.   Objective:    General: Well Developed, well nourished, and in no acute distress.  Neuro: Alert and oriented x3, extra-ocular muscles intact, sensation grossly intact.  HEENT: Normocephalic, atraumatic, pupils equal round reactive to light, neck supple, no masses, no lymphadenopathy, thyroid nonpalpable.  Skin: Warm and dry, no rashes. Cardiac: Regular rate and rhythm, no murmurs rubs or gallops, no lower extremity edema.  Respiratory: Clear to auscultation bilaterally. Not using accessory muscles, speaking in full sentences.  Impression and Recommendations:    Depression with anxiety Improved considerably with Lexapro and hydroxyzine, continue these medications, no changes needed.  Mass of breast, right Additional imaging scheduled.   ___________________________________________ Gwen Her. Dianah Field, M.D., ABFM., CAQSM. Primary Care and Sports Medicine [place] [facility]  Adjunct Professor of [place] [employer] [facility] [employer] Medicine

## 2019-08-20 NOTE — Assessment & Plan Note (Signed)
Improved considerably with Lexapro and hydroxyzine, continue these medications, no changes needed.

## 2019-08-20 NOTE — Assessment & Plan Note (Signed)
Additional imaging scheduled.

## 2019-08-27 ENCOUNTER — Ambulatory Visit: Payer: Medicaid Other

## 2019-08-27 ENCOUNTER — Other Ambulatory Visit: Payer: Self-pay

## 2019-08-29 ENCOUNTER — Other Ambulatory Visit (HOSPITAL_COMMUNITY): Payer: Self-pay | Admitting: *Deleted

## 2019-08-29 DIAGNOSIS — N6310 Unspecified lump in the right breast, unspecified quadrant: Secondary | ICD-10-CM

## 2019-09-12 ENCOUNTER — Encounter (HOSPITAL_COMMUNITY): Payer: Self-pay

## 2019-09-12 ENCOUNTER — Ambulatory Visit
Admission: RE | Admit: 2019-09-12 | Discharge: 2019-09-12 | Disposition: A | Discharge status: Home / Self Care | Payer: No Typology Code available for payment source | Source: Ambulatory Visit | Attending: Obstetrics and Gynecology | Admitting: Obstetrics and Gynecology

## 2019-09-12 ENCOUNTER — Ambulatory Visit
Admission: RE | Admit: 2019-09-12 | Discharge: 2019-09-12 | Disposition: A | Discharge status: Home / Self Care | Payer: Medicaid Other | Source: Ambulatory Visit | Attending: Obstetrics and Gynecology | Admitting: Obstetrics and Gynecology

## 2019-09-12 ENCOUNTER — Other Ambulatory Visit: Payer: Self-pay

## 2019-09-12 ENCOUNTER — Other Ambulatory Visit (HOSPITAL_COMMUNITY): Payer: Self-pay | Admitting: Obstetrics and Gynecology

## 2019-09-12 ENCOUNTER — Ambulatory Visit (HOSPITAL_COMMUNITY)
Admission: RE | Admit: 2019-09-12 | Discharge: 2019-09-12 | Disposition: A | Discharge status: Home / Self Care | Payer: Medicaid Other | Source: Ambulatory Visit | Attending: Obstetrics and Gynecology | Admitting: Obstetrics and Gynecology

## 2019-09-12 DIAGNOSIS — N6310 Unspecified lump in the right breast, unspecified quadrant: Secondary | ICD-10-CM

## 2019-09-12 DIAGNOSIS — Z1239 Encounter for other screening for malignant neoplasm of breast: Secondary | ICD-10-CM | POA: Insufficient documentation

## 2019-09-12 NOTE — Addendum Note (Signed)
Encounter addended by: Loletta Parish, RN on: 09/12/2019 10:51 AM  Actions taken: Clinical Note Signed

## 2019-09-12 NOTE — Patient Instructions (Addendum)
Explained breast self awareness with Teresa Mayer. Patient has [facility] therefore Pap smear not covered by [facility]. Patient has an appointment scheduled  with her PCP for a Pap smear on 10/09/2019 for a Pap smear. Referred patient to the [facility] for a right breast diagnostic mammogram and ultrasound per recommendation. Appointment scheduled for Thursday, September 12, 2019 at 1010. Patient aware of appointment and will be there. Discussed smoking cessation with patient. Referred to the [facility] Quitline and gave resources to the free smoking cessation classes at [facility]. Teresa Mayer verbalized understanding.  Teresa Mayer, Arvil Chaco, RN 8:25 AM

## 2019-09-12 NOTE — Progress Notes (Addendum)
Patient referred to [facility] by the [place] due to recommending additional imaging of her right breast. Screening mammogram completed 08/15/2019.  Pap Smear: Pap smear not completed today. Last Pap smear was 10 years ago and normal per patient. Per patient has a history of an abnormal Pap smear 25 years ago that a colposcopy was completed for follow-up. Patient stated she has had at least three normal Pap smears since colposcopy. Patient has [facility], therefore Pap smear not covered by [facility]. Patient has an appointment scheduled  with her PCP for a Pap smear on 10/09/2019 for a Pap smear. No Pap smear results are in Epic.  Physical exam: Breasts Breasts symmetrical. No skin abnormalities bilateral breasts. No nipple retraction bilateral breasts. No nipple discharge bilateral breasts. No lymphadenopathy. No lumps palpated bilateral breasts. No complaints of pain or tenderness on exam. Referred patient to the [place] for a right breast diagnostic mammogram and ultrasound per recommendation. Appointment scheduled for Thursday, September 12, 2019 at 1010.        Pelvic/Bimanual No Pap smear completed today since patient has [facility] and not covered by [employer]. Patient has a Pap smear scheduled with her PCP 10/09/2019.  Smoking History: Patient is a current smoker. Discussed smoking cessation with patient. Referred to the [facility] Quitline and gave resources to the free smoking cessation classes at [facility].  Patient Navigation: Patient education provided. Access to services provided for patient through [place] program.  Colorectal Cancer Screening: Per patient has never had a colonoscopy completed. Patient stated her PCP gave her a FIT test to complete at home. No complaints today.   Breast and Cervical Cancer Risk Assessment: Patient has no family history of breast cancer, known genetic mutations, or radiation treatment to the chest before  age 62. Per patient has a history of cervical dysplasia. Patient has no history of being immunocompromised or DES exposure in-utero.  Risk Assessment    Risk Scores      09/12/2019   Last edited by: Loletta Parish, RN   5-year risk: 1.1 %   Lifetime risk: 7.4 %

## 2019-10-28 ENCOUNTER — Telehealth (HOSPITAL_COMMUNITY): Payer: Self-pay

## 2019-10-28 NOTE — Telephone Encounter (Signed)
Left message on voicemail requesting patient to return call to office.  

## 2020-03-13 ENCOUNTER — Other Ambulatory Visit: Payer: No Typology Code available for payment source

## 2020-04-21 IMAGING — DX LEFT FOREARM - 2 VIEW
2 series · 2 of 2 positions shown · non-contrast
Comparison: 12/05/2018 and 12/12/2018.

CLINICAL DATA: Ulna fracture from injury 3 weeks ago.

EXAM:
LEFT FOREARM - 2 VIEW

[forearm ap]
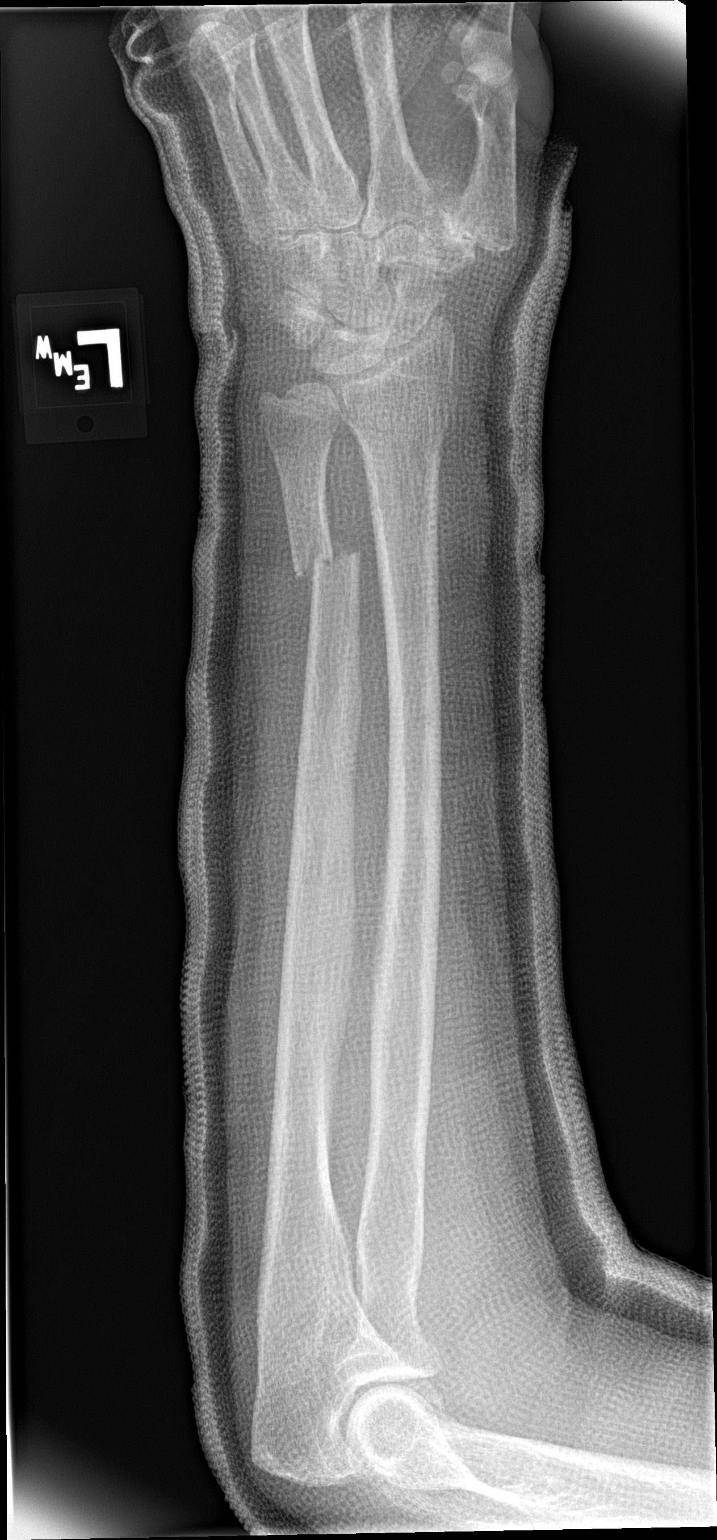

[forearm lat]
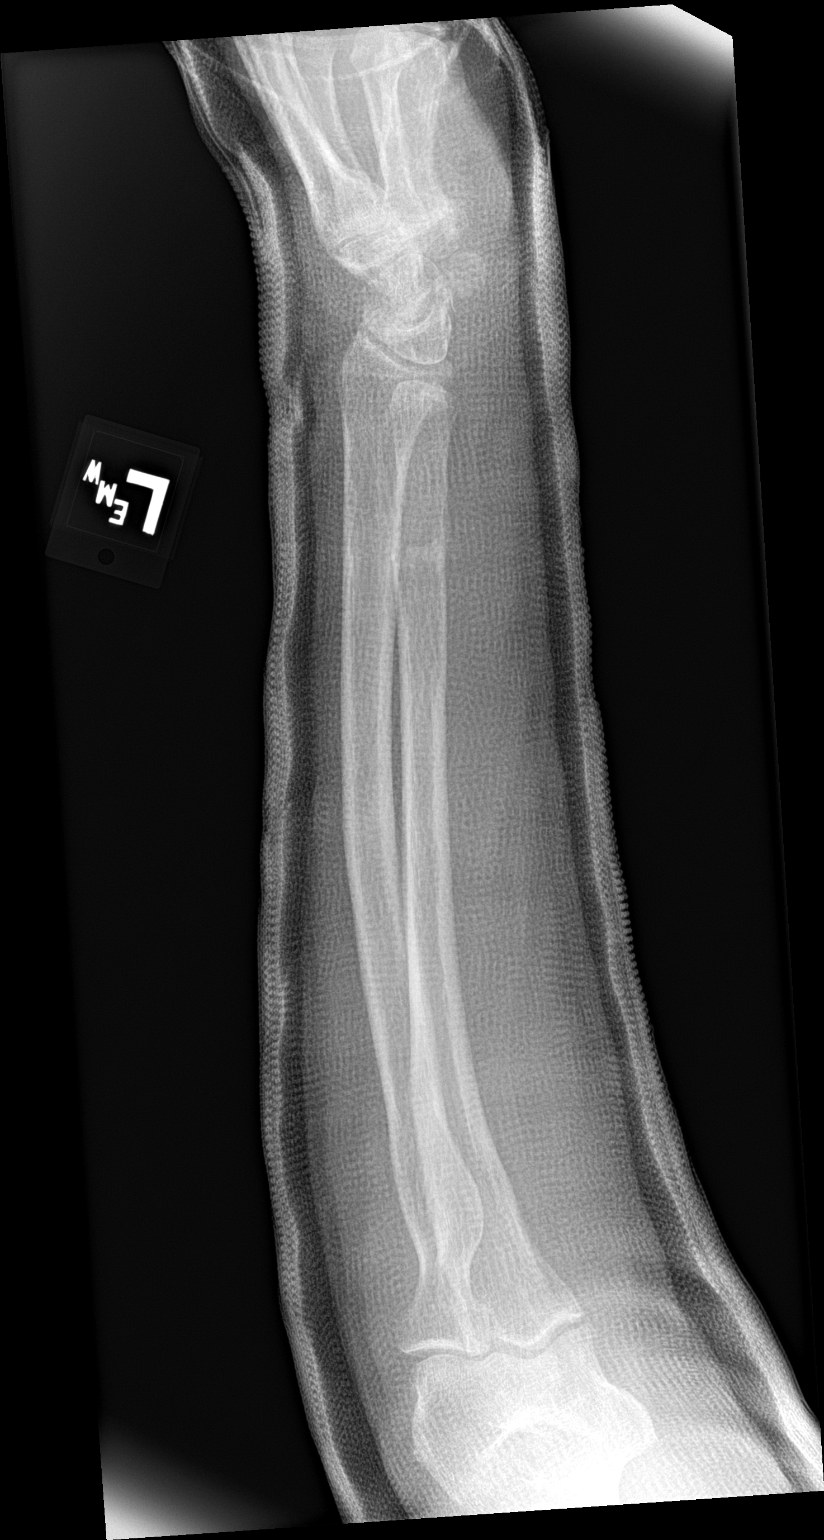

[2 of 2 positions shown; findings below may reference images not displayed]

FINDINGS: Transverse fracture of the distal ulnar shaft, displaced in an ulnar
direction by 6 mm. There is subtle callus formation developing along
the radial side of the fracture. The degree of displacement, in the
mild ulnar angulation, is stable from the prior exam.

No new fractures.  No bone lesions.

Wrist and elbow joints are normally spaced and aligned.
IMPRESSION: 1. Minimal interval healing of the distal left ulnar fracture since
the prior exam with subtle callus formation now noted. No other
change. No change in the degree of displacement.

## 2021-05-26 ENCOUNTER — Encounter: Payer: Self-pay | Admitting: Family Medicine

## 2023-03-23 ENCOUNTER — Emergency Department (HOSPITAL_BASED_OUTPATIENT_CLINIC_OR_DEPARTMENT_OTHER)
Admission: EM | Admit: 2023-03-23 | Discharge: 2023-03-24 | Disposition: A | Discharge status: Home / Self Care | Payer: Medicaid Other | Attending: Emergency Medicine | Admitting: Emergency Medicine

## 2023-03-23 ENCOUNTER — Encounter (HOSPITAL_BASED_OUTPATIENT_CLINIC_OR_DEPARTMENT_OTHER): Payer: Self-pay

## 2023-03-23 DIAGNOSIS — Z77098 Contact with and (suspected) exposure to other hazardous, chiefly nonmedicinal, chemicals: Secondary | ICD-10-CM | POA: Diagnosis not present

## 2023-03-23 DIAGNOSIS — R202 Paresthesia of skin: Secondary | ICD-10-CM | POA: Diagnosis not present

## 2023-03-23 DIAGNOSIS — R000 Tachycardia, unspecified: Secondary | ICD-10-CM | POA: Insufficient documentation

## 2023-03-23 DIAGNOSIS — F419 Anxiety disorder, unspecified: Secondary | ICD-10-CM | POA: Insufficient documentation

## 2023-03-23 DIAGNOSIS — F17210 Nicotine dependence, cigarettes, uncomplicated: Secondary | ICD-10-CM | POA: Diagnosis not present

## 2023-03-23 DIAGNOSIS — R42 Dizziness and giddiness: Secondary | ICD-10-CM | POA: Diagnosis present

## 2023-03-23 NOTE — Discharge Instructions (Signed)
You were evaluated in the Emergency Department and after careful evaluation, we did not find any emergent condition requiring admission or further testing in the hospital.  Your exam/testing today was overall reassuring.  Symptoms likely due to chemical exposure.  Please return to the Emergency Department if you experience any worsening of your condition.  Thank you for allowing [place] to be a part of your care. Vs

## 2023-03-23 NOTE — ED Provider Notes (Signed)
[facility] EMERGENCY [facility] Emergency Department Provider Note MRN:  [id number]  Arrival date & time: 03/23/23     Chief Complaint   Dizziness   History of Present Illness   Teresa Mayer is a 57 y.o. year-old female with no pertinent past medical history presenting to the ED with chief complaint of dizziness.  Patient explains she was cleaning her shoes with a mixture of bleach and gain laundry detergent.  She then used shampoo to wash her hands.  She then suddenly felt very unwell with nausea, lightheadedness.  She became concerned that she was racing by the chemicals.  She then became very anxious, paresthesias to bilateral hands, came here for evaluation.  Review of Systems  A thorough review of systems was obtained and all systems are negative except as noted in the HPI and PMH.   Patient's Health History   History reviewed. No pertinent past medical history.  Past Surgical History:  Procedure Laterality Date   ECTOPIC PREGNANCY SURGERY      Family History  Problem Relation Age of Onset   Hypertension Father    Depression Sister     Social History   Socioeconomic History   Marital status: Married    Spouse name: Not on file   Number of children: Not on file   Years of education: Not on file   Highest education level: 12th grade  Occupational History   Not on file  Tobacco Use   Smoking status: Every Day    Packs/day: 1    Types: Cigarettes   Smokeless tobacco: Never  Vaping Use   Vaping Use: Never used  Substance and Sexual Activity   Alcohol use: Yes    Comment: occ   Drug use: No   Sexual activity: Not Currently    Birth control/protection: Post-menopausal  Other Topics Concern   Not on file  Social History Narrative   Not on file   Social Determinants of Health   Financial Resource Strain: Not on file  Food Insecurity: Not on file  Transportation Needs: No Transportation Needs (09/12/2019)   PRAPARE - Scientist, research (physical sciences) (Medical): No    Lack of Transportation (Non-Medical): No  Physical Activity: Not on file  Stress: Not on file  Social Connections: Not on file  Intimate Partner Violence: Not on file     Physical Exam   Vitals:   03/23/23 2121 03/23/23 2135  BP: (!) 157/106 (!) 162/114  Pulse: (!) 116 (!) 115  Resp: (!) 22   Temp: 97.9 F (36.6 C)   SpO2: 98% 94%    CONSTITUTIONAL: Well-appearing, NAD NEURO/PSYCH:  Alert and oriented x 3, no focal deficits EYES:  eyes equal and reactive ENT/NECK:  no LAD, no JVD CARDIO: Regular rate, well-perfused, normal S1 and S2 PULM:  CTAB no wheezing or rhonchi GI/GU:  non-distended, non-tender MSK/SPINE:  No gross deformities, no edema SKIN:  no rash, atraumatic   *Additional and/or pertinent findings included in MDM below  Diagnostic and Interventional Summary    EKG Interpretation Date/Time:  Thursday March 23 2023 21:19:06 EDT Ventricular Rate:  114 PR Interval:  204 QRS Duration:  90 QT Interval:  314 QTC Calculation: 432 R Axis:   52  Text Interpretation: Sinus tachycardia Anteroseptal infarct , age undetermined Abnormal ECG No previous ECGs available Confirmed by Kennis Carina [id number]) on 03/23/2023 11:56:36 PM       Labs Reviewed - No data to display  No orders to display  Medications - No data to display   Procedures  /  Critical Care Procedures  ED Course and Medical Decision Making  Initial Impression and Ddx Suspect symptoms related to chemical exposure, possible chlorine gas.  She is feeling a lot better at this time.  Mildly tachycardic in triage, reportedly was appearing very anxious.  She is sitting comfortably on my exam with no tachycardia, saying that the symptoms are much improved.  Never had any chest pain or shortness of breath, no airway concerns, no concerning skin findings.  Suspect a component of anxiety or panic attack with the exposure.  Past medical/surgical history that increases complexity  of ED encounter: None  Interpretation of Diagnostics I personally reviewed the EKG and my interpretation is as follows: Sinus rhythm    Patient Reassessment and Ultimate Disposition/Management     No emergent process, appropriate for discharge.  Patient management required discussion with the following services or consulting groups:  None  Complexity of Problems Addressed Acute illness or injury that poses threat of life of bodily function  Additional Data Reviewed and Analyzed Further history obtained from: None  Additional Factors Impacting ED Encounter Risk None  Elmer Sow. Pilar Plate, MD [facility] Emergency Medicine [facility] [email or web address]  Final Clinical Impressions(s) / ED Diagnoses     ICD-10-CM   1. Chemical exposure  [phone]       ED Discharge Orders     None        Discharge Instructions Discussed with and Provided to Patient:    Discharge Instructions      You were evaluated in the Emergency Department and after careful evaluation, we did not find any emergent condition requiring admission or further testing in the hospital.  Your exam/testing today was overall reassuring.  Symptoms likely due to chemical exposure.  Please return to the Emergency Department if you experience any worsening of your condition.  Thank you for allowing [place] to be a part of your care. Vs       Sabas Sous, MD 03/23/23 [phone]

## 2023-03-23 NOTE — ED Triage Notes (Signed)
Pt via [facility] after she was reportedly cleaning shoes with bleach & laundry detergent. States she began feeling dizzy; daughter is drug user so pt used narcan x2. Pt reports feeling she's been "poisoned," appears very anxious during triage.
